# Patient Record
Sex: Male | Born: 1970 | Race: White | Hispanic: No | Marital: Single | Smoking: Current every day smoker
Health system: Southern US, Community
[De-identification: ages and names within clinical notes are randomized; demographics above are authoritative.]

## PROBLEM LIST (undated history)

## (undated) HISTORY — PX: NO PAST SURGERIES: SHX2092

---

## 2005-12-11 ENCOUNTER — Emergency Department (HOSPITAL_COMMUNITY): Admission: EM | Admit: 2005-12-11 | Discharge: 2005-12-11 | Payer: Self-pay | Admitting: Family Medicine

## 2006-03-17 ENCOUNTER — Emergency Department (HOSPITAL_COMMUNITY): Admission: EM | Admit: 2006-03-17 | Discharge: 2006-03-17 | Payer: Self-pay | Admitting: Emergency Medicine

## 2006-12-02 ENCOUNTER — Emergency Department (HOSPITAL_COMMUNITY): Admission: EM | Admit: 2006-12-02 | Discharge: 2006-12-03 | Payer: Self-pay | Admitting: Emergency Medicine

## 2007-09-18 ENCOUNTER — Encounter: Admission: RE | Admit: 2007-09-18 | Discharge: 2007-09-18 | Payer: Self-pay | Admitting: Internal Medicine

## 2012-06-29 ENCOUNTER — Encounter (HOSPITAL_COMMUNITY): Payer: Self-pay | Admitting: Emergency Medicine

## 2012-06-29 ENCOUNTER — Inpatient Hospital Stay (HOSPITAL_COMMUNITY)
Admission: EM | Admit: 2012-06-29 | Discharge: 2012-07-01 | DRG: 294 | Disposition: A | Discharge status: Home / Self Care | Payer: BC Managed Care – HMO | Attending: Internal Medicine | Admitting: Internal Medicine

## 2012-06-29 DIAGNOSIS — R21 Rash and other nonspecific skin eruption: Secondary | ICD-10-CM | POA: Diagnosis present

## 2012-06-29 DIAGNOSIS — Z720 Tobacco use: Secondary | ICD-10-CM | POA: Diagnosis present

## 2012-06-29 DIAGNOSIS — E1310 Other specified diabetes mellitus with ketoacidosis without coma: Principal | ICD-10-CM | POA: Diagnosis present

## 2012-06-29 DIAGNOSIS — Z7289 Other problems related to lifestyle: Secondary | ICD-10-CM

## 2012-06-29 DIAGNOSIS — F17200 Nicotine dependence, unspecified, uncomplicated: Secondary | ICD-10-CM | POA: Diagnosis present

## 2012-06-29 DIAGNOSIS — F1090 Alcohol use, unspecified, uncomplicated: Secondary | ICD-10-CM

## 2012-06-29 DIAGNOSIS — D696 Thrombocytopenia, unspecified: Secondary | ICD-10-CM | POA: Diagnosis present

## 2012-06-29 DIAGNOSIS — E871 Hypo-osmolality and hyponatremia: Secondary | ICD-10-CM | POA: Diagnosis present

## 2012-06-29 DIAGNOSIS — I10 Essential (primary) hypertension: Secondary | ICD-10-CM | POA: Diagnosis present

## 2012-06-29 DIAGNOSIS — E1110 Type 2 diabetes mellitus with ketoacidosis without coma: Secondary | ICD-10-CM | POA: Diagnosis present

## 2012-06-29 DIAGNOSIS — E860 Dehydration: Secondary | ICD-10-CM | POA: Diagnosis present

## 2012-06-29 DIAGNOSIS — Z9119 Patient's noncompliance with other medical treatment and regimen: Secondary | ICD-10-CM

## 2012-06-29 DIAGNOSIS — Z91199 Patient's noncompliance with other medical treatment and regimen due to unspecified reason: Secondary | ICD-10-CM

## 2012-06-29 LAB — GLUCOSE, CAPILLARY
Glucose-Capillary: 384 mg/dL — ABNORMAL HIGH (ref 70–99)
Glucose-Capillary: 250 mg/dL — ABNORMAL HIGH (ref 70–99)

## 2012-06-29 LAB — BLOOD GAS, VENOUS
Acid-base deficit: 5.6 mmol/L — ABNORMAL HIGH (ref 0.0–2.0)
Bicarbonate: 19.2 mEq/L — ABNORMAL LOW (ref 20.0–24.0)
FIO2: 0.21 %
Patient temperature: 98.6
pCO2, Ven: 37.4 mmHg — ABNORMAL LOW (ref 45.0–50.0)

## 2012-06-29 LAB — BASIC METABOLIC PANEL
Chloride: 97 mEq/L (ref 96–112)
GFR calc non Af Amer: >90 mL/min (ref >90)
Sodium: 131 mEq/L — ABNORMAL LOW (ref 135–145)

## 2012-06-29 LAB — CBC
Hemoglobin: 16.3 g/dL (ref 13.0–17.0)
MCH: 31.8 pg (ref 26.0–34.0)
RDW: 13.2 % (ref 11.5–15.5)

## 2012-06-29 LAB — URINALYSIS, ROUTINE W REFLEX MICROSCOPIC
Bilirubin Urine: NEGATIVE
Glucose, UA: >1000 mg/dL — AB
Hgb urine dipstick: NEGATIVE
Urobilinogen, UA: 0.2 mg/dL (ref 0.0–1.0)

## 2012-06-29 MED ORDER — INSULIN REGULAR BOLUS VIA INFUSION
0.0000 [IU] | Freq: Three times a day (TID) | INTRAVENOUS | Status: DC
  Filled 2012-06-29: qty 10

## 2012-06-29 MED ORDER — DEXTROSE 50 % IV SOLN: 25.0000 mL | INTRAVENOUS | Status: DC | PRN

## 2012-06-29 MED ORDER — LORAZEPAM 2 MG/ML IJ SOLN: 1.0000 mg | Freq: Four times a day (QID) | INTRAMUSCULAR | Status: DC | PRN

## 2012-06-29 MED ORDER — SODIUM CHLORIDE 0.9 % IV BOLUS (SEPSIS)
1000.0000 mL | Freq: Once | INTRAVENOUS | Status: AC
  Administered 2012-06-29: 1000 mL via INTRAVENOUS

## 2012-06-29 MED ORDER — DEXTROSE-NACL 5-0.45 % IV SOLN: INTRAVENOUS | Status: DC

## 2012-06-29 MED ORDER — FOLIC ACID 1 MG PO TABS
1.0000 mg | ORAL_TABLET | Freq: Every day | ORAL | Status: DC
  Administered 2012-06-30 – 2012-07-01 (×2): 1 mg via ORAL
  Filled 2012-06-29 (×2): qty 1

## 2012-06-29 MED ORDER — VITAMIN B-1 100 MG PO TABS
100.0000 mg | ORAL_TABLET | Freq: Every day | ORAL | Status: DC
  Administered 2012-06-30 – 2012-07-01 (×2): 100 mg via ORAL
  Filled 2012-06-29 (×2): qty 1

## 2012-06-29 MED ORDER — ADULT MULTIVITAMIN W/MINERALS CH
1.0000 | ORAL_TABLET | Freq: Every day | ORAL | Status: DC
  Administered 2012-06-30 – 2012-07-01 (×2): 1 via ORAL
  Filled 2012-06-29 (×2): qty 1

## 2012-06-29 MED ORDER — SODIUM CHLORIDE 0.9 % IV SOLN
INTRAVENOUS | Status: DC
  Administered 2012-06-29: via INTRAVENOUS

## 2012-06-29 MED ORDER — LORAZEPAM 1 MG PO TABS
1.0000 mg | ORAL_TABLET | Freq: Four times a day (QID) | ORAL | Status: DC | PRN
  Administered 2012-06-30: 1 mg via ORAL
  Filled 2012-06-29: qty 1

## 2012-06-29 MED ORDER — INSULIN REGULAR HUMAN 100 UNIT/ML IJ SOLN
INTRAMUSCULAR | Status: DC
  Administered 2012-06-29: 1.9 [IU]/h via INTRAVENOUS
  Filled 2012-06-29: qty 1

## 2012-06-29 MED ORDER — THIAMINE HCL 100 MG/ML IJ SOLN
100.0000 mg | Freq: Every day | INTRAMUSCULAR | Status: DC
  Filled 2012-06-29 (×2): qty 1

## 2012-06-29 NOTE — ED Provider Notes (Signed)
History     CSN: [id number]  Arrival date & time 06/29/12  2027   First MD Initiated Contact with Patient 06/29/12 2101      Chief Complaint  Patient presents with  . Dizziness    (Consider location/radiation/quality/duration/timing/severity/associated sxs/prior treatment) HPI Comments: Patient with a history of type 1 diabetes presents emergency department after having blood sugars in the 300s.  She was brought via [employer] custody for driving while impaired.  Patient states that he has been noncompliant on his diabetic medication including metformin 500 twice a day x2 months.  He explains that he did not have refills or primary care physician for his prescription.  Patient has been complaining of dizziness and occasional "foggy thinking".  Associated symptoms include polyuria, nocturia, & increase thirst. He denies any abdominal pain, nausea, vomiting, syncope, fever, night sweats, chills, recent illness, chest pain, shortness of breath or muscle aches.    The history is provided by the patient.    Past Medical History  Diagnosis Date  . Diabetes mellitus     History reviewed. No pertinent past surgical history.  Family History  Problem Relation Age of Onset  . Diabetes Other   . Hypertension Other     History  Substance Use Topics  . Smoking status: Current Everyday Smoker  . Smokeless tobacco: Not on file  . Alcohol Use: Yes      Review of Systems  Constitutional: Negative for fever, chills, diaphoresis, activity change and appetite change.  HENT: Negative for congestion and neck pain.   Eyes: Negative for visual disturbance.  Respiratory: Negative for cough and shortness of breath.   Cardiovascular: Negative for chest pain and leg swelling.  Gastrointestinal: Negative for abdominal pain.  Genitourinary: Positive for frequency. Negative for dysuria and urgency.  Musculoskeletal: Negative for myalgias.  Skin: Negative for color change and wound.  Neurological:  Positive for dizziness. Negative for syncope, weakness, light-headedness, numbness and headaches.  Psychiatric/Behavioral: Negative for confusion.  All other systems reviewed and are negative.    Allergies  Review of patient's allergies indicates no known allergies.  Home Medications   Current Outpatient Rx  Name Route Sig Dispense Refill  . IBUPROFEN 200 MG PO TABS Oral Take 400 mg by mouth every 6 (six) hours as needed. Pain      BP 153/134  Pulse 109  Temp 98.7 F (37.1 C) (Oral)  Resp 16  SpO2 94%  Physical Exam  Nursing note and vitals reviewed. Constitutional: He is oriented to person, place, and time. He appears well-developed and well-nourished. No distress.       Patient not in visible distress, fruity breath  HENT:  Head: Normocephalic and atraumatic.  Eyes: Conjunctivae and EOM are normal. Pupils are equal, round, and reactive to light.  Neck: Normal range of motion. Neck supple. Normal carotid pulses, no hepatojugular reflux and no JVD present. Carotid bruit is not present.       No carotid bruits  Cardiovascular:       Tachycardic regular rhythm, no aberrancy on auscultation, intact distal pulses no pitting edema bilaterally.  Pulmonary/Chest: Effort normal and breath sounds normal. No respiratory distress.       Lungs clear to auscultation bilaterally, no acute respiratory distress, accessory muscle use or nasal flaring.  Abdominal: Soft. He exhibits no distension. There is no tenderness.       Nonpulsatile aorta  Musculoskeletal: Normal range of motion. He exhibits no tenderness.  Neurological: He is alert and oriented to person, place,  and time. He displays a negative Romberg sign.       Cranial nerves III through XII intact, normal coordination, negative Romberg, strength 5/5 bilaterally. No ataxia  Skin: Skin is warm and dry. No pallor.       Erythematous molted rash on upper extremities and anterior trunk.   Psychiatric: He has a normal mood and affect.  His behavior is normal.    ED Course  Procedures (including critical care time)  Labs Reviewed  GLUCOSE, CAPILLARY - Abnormal; Notable for the following:    Glucose-Capillary 384 (*)     All other components within normal limits  URINALYSIS, ROUTINE W REFLEX MICROSCOPIC - Abnormal; Notable for the following:    Glucose, UA >1000 (*)     All other components within normal limits  BASIC METABOLIC PANEL - Abnormal; Notable for the following:    Sodium 131 (*)     CO2 17 (*)     Glucose, Bld 406 (*)     All other components within normal limits  CBC - Abnormal; Notable for the following:    Platelets 149 (*)     All other components within normal limits  URINE MICROSCOPIC-ADD ON  BLOOD GAS, VENOUS   No results found.   No diagnosis found.    MDM  DKA  Patient is a type I diabetic presents emergency Department tachycardic withfruity breath complaining of dizziness, polyuria, nocturia, and increase thirst.  Patient has been noncompliant on his metformin for 2 months.  Labs reviewed.  Patient is acidotic with anion gap of 17.  Blood glucose of 400 and CBG pending.  Fluids have been started, pt on glucose stabalizer and hospitalist contacted for admission.  The patient appears reasonably stabilized for admission considering the current resources, flow, and capabilities available in the ED at this time, and I doubt any other [facility] requiring further screening and/or treatment in the ED prior to admission.         Jaci Carrel, [place] 06/29/12 2258

## 2012-06-29 NOTE — ED Notes (Signed)
Pt was checked out by EMS for c/o dizziness and fatigue  Pt states he is diabetic and EMS said sugar was in the 300s so pt brought here to be checked out  Pt is under police custody for driving while impaired  Pt is in handcuffs upon arrival to dept

## 2012-06-29 NOTE — ED Notes (Signed)
Pt reports drinking 1 beer today. Pt states he is a type 1 diabetic. Pt states he did not take his insulin today because he is out of insulin. Pt reports increase urinary frequency, dizziness, and thirst.

## 2012-06-29 NOTE — H&P (Signed)
[facility] History and Physical  Isabel Freese ZOX:[id number] DOB: 1971-05-21 DOA: 06/29/2012  Referring physician: Bethann Berkshire, MD PCP: No primary provider on file.   Chief Complaint: Dizziness.  HPI: Calvin Long is a 41 y.o. male  41 year old male patient with history of type 2 diabetes mellitus diagnosed 5-6 years ago, noncompliant with medications or M.D. followups, was brought to the ED via [employer] custody for DUI with complaints of dizziness. Patient indicates that he only drank 2 beers tonight and when he was pulled over by the police, he had dizziness but denies any headache or passing out or blurred vision. He complains of dry mouth, polyuria, polydipsia and burning of his feet. The symptoms have been ongoing for a couple of months. He does not check his blood sugars. He stopped taking his oral hypoglycemic medications approximately 6 months ago when his insurance had temporarily expired. He has not seen an M.D. for greater than 2 years. He developed a generalized skin rash over the last month which is intermittently pruritic. In the emergency department, evaluation reveals sodium 131, bicarbonate 17, blood sugar 406, platelets 149, venous blood gas pH of 7.331, anion gap of 17. The hospitalist service is requested to admit for further evaluation and management.  Review of Systems: The patient denies anorexia, fever, weight loss,, vision loss, decreased hearing, hoarseness, chest pain, syncope, dyspnea on exertion, peripheral edema, balance deficits, hemoptysis, abdominal pain, melena, hematochezia, severe indigestion/heartburn, hematuria, incontinence, genital sores, muscle weakness, suspicious skin lesions, transient blindness, difficulty walking, depression, unusual weight change, abnormal bleeding, enlarged lymph nodes, angioedema, and breast masses.   All systems reviewed and apart from history of presenting illness is pertinent for burning pain in both feet which he has  had intermittently for the last couple of months which at times makes it difficult for ambulation. He denies any asymmetrical limb weakness or slurred speech or facial asymmetry. Rest of review of system is negative.  Past Medical History  Diagnosis Date  . Diabetes mellitus    Past Surgical History  Procedure Date  . No past surgeries    Social History:  reports that he has been smoking.  He does not have any smokeless tobacco history on file. He reports that he drinks alcohol. He reports that he does not use illicit drugs. Patient is single. He works as a Medical illustrator. He is independent of activities of daily living. He smokes one to one and a half packs of cigarettes per day. He claims that he drinks 2 times per week including a couple of beers. He denies drug abuse.  No Known Allergies  Family History  Problem Relation Age of Onset  . Diabetes Other   . Hypertension Other   . Diabetes Mother    Prior to Admission medications   Medication Sig Start Date End Date Taking? Authorizing Provider  ibuprofen (ADVIL,MOTRIN) 200 MG tablet Take 400 mg by mouth every 6 (six) hours as needed. Pain   Yes Historical Provider, MD   Physical Exam: Filed Vitals:   06/29/12 2117 06/29/12 2118 06/29/12 2304 06/29/12 2324  BP: 163/139 153/134  137/80  Pulse: 111 109  77  Temp:      TempSrc:      Resp:    16  Height:   6\' 1"  (1.854 m)   Weight:   102.059 kg (225 lb)   SpO2:    97%   General exam: Moderately built and nourished male patient who is lying comfortably supine on the gurney and is  in no obvious distress.  Head, eyes and ENT: Nontraumatic and normocephalic. Oral mucosa dry but no other acute findings. Lymphatics: No lymphadenopathy.  Neck: Supple. No JVD or carotid bruits.  Respiratory system: Clear. No increased work of breathing.  Cardiovascular system: First and second heart sounds heard, regular rate and rhythm. No murmurs, gallops or JVD. Gastrointestinal system: Abdomen is  nondistended, nontender, soft and normal bowel sounds heard. No organomegaly or masses appreciated.  Central nervous system: Alert and oriented. No focal neurological deficits.  Extremities: Symmetric 5 x 5 power.  Skin: Diffuse, macular skin rash on trunk and upper extremities with irregular margins, slightly erythematous. Musculoskeletal system: Negative exam.  Psychiatric: Pleasant and cooperative.  Labs on Admission:  Basic Metabolic Panel:  Lab 06/29/12 7829  NA 131*  K 3.9  CL 97  CO2 17*  GLUCOSE 406*  BUN 11  CREATININE 0.70  CALCIUM 8.9  MG --  PHOS --   Liver Function Tests: No results found for this basename: AST:5,ALT:5,ALKPHOS:5,BILITOT:5,PROT:5,ALBUMIN:5 in the last 168 hours No results found for this basename: LIPASE:5,AMYLASE:5 in the last 168 hours No results found for this basename: AMMONIA:5 in the last 168 hours CBC:  Lab 06/29/12 2107  WBC 6.1  NEUTROABS --  HGB 16.3  HCT 46.1  MCV 90.0  PLT 149*   Cardiac Enzymes: No results found for this basename: CKTOTAL:5,CKMB:5,CKMBINDEX:5,TROPONINI:5 in the last 168 hours  BNP (last 3 results) No results found for this basename: PROBNP:3 in the last 8760 hours CBG:  Lab 06/29/12 2326 06/29/12 2035  GLUCAP 250* 384*    Radiological Exams on Admission: No results found.  VBG: PH 7.331, PCO2 37.4, PO2 55.    Assessment/Plan Principal Problem:  *DKA, type 2, not at goal Active Problems:  Dehydration  Hyponatremia  Tobacco abuse  Alcohol use  HTN (hypertension)  Skin rash  Thrombocytopenia   1. Mild DKA in patient with uncontrolled type 2 diabetes mellitus secondary to noncompliance: Admit to telemetry. Aggressive IV fluid hydration and IV insulin. Once anion gap has closed and bicarbonate has normalized, transition to sliding scale insulin +/-Lantus. Check hemoglobin A1c. Counseled patient regarding compliance with diabetes treatment and he verbalized understanding. We'll get diabetes  coordinator to consult. 2. Dehydration: Secondary to problem #1. IV fluids. 3. Hyponatremia: Due to combination of hyperglycemia and dehydration. Management as above and monitor BMPs. 4. Hypertension: Patient denies history of hypertension. Patient had markedly elevated blood pressure on arrival but has spontaneously improved. Monitor closely off medications. 5. Tobacco abuse: Cessation counseled. 6. Alcohol use: Unclear as to amount of usage. Will place on Ativan protocol. 7. Mild thrombocytopenia: Follow CBCs in a.m.  8. Skin rash: Unclear etiology.? Related to diabetes mellitus. Consider dermatology consultation as outpatient.  Code Status: Full  Family Communication: None  Disposition Plan: To be determined.   Time spent: 55 minutes.   [facility] Pager [phone].   If 7PM-7AM, please contact night-coverage [email or web address] Password [facility] 06/29/2012, 11:53 PM

## 2012-06-29 NOTE — ED Provider Notes (Signed)
Medical screening examination/treatment/procedure(s) were performed by non-physician practitioner and as supervising physician I was immediately available for consultation/collaboration.   Kiara Mcdowell L Colman Birdwell, MD 06/29/12 2321 

## 2012-06-30 ENCOUNTER — Encounter (HOSPITAL_COMMUNITY): Payer: Self-pay | Admitting: *Deleted

## 2012-06-30 DIAGNOSIS — Z7289 Other problems related to lifestyle: Secondary | ICD-10-CM

## 2012-06-30 LAB — GLUCOSE, CAPILLARY
Glucose-Capillary: 192 mg/dL — ABNORMAL HIGH (ref 70–99)
Glucose-Capillary: 218 mg/dL — ABNORMAL HIGH (ref 70–99)
Glucose-Capillary: 138 mg/dL — ABNORMAL HIGH (ref 70–99)
Glucose-Capillary: 144 mg/dL — ABNORMAL HIGH (ref 70–99)
Glucose-Capillary: 173 mg/dL — ABNORMAL HIGH (ref 70–99)
Glucose-Capillary: 337 mg/dL — ABNORMAL HIGH (ref 70–99)
Glucose-Capillary: 200 mg/dL — ABNORMAL HIGH (ref 70–99)

## 2012-06-30 LAB — BASIC METABOLIC PANEL
BUN: 8 mg/dL (ref 6–23)
BUN: 7 mg/dL (ref 6–23)
BUN: 8 mg/dL (ref 6–23)
CO2: 19 mEq/L (ref 19–32)
CO2: 18 mEq/L — ABNORMAL LOW (ref 19–32)
CO2: 20 mEq/L (ref 19–32)
Calcium: 7.7 mg/dL — ABNORMAL LOW (ref 8.4–10.5)
Chloride: 105 mEq/L (ref 96–112)
Chloride: 105 mEq/L (ref 96–112)
Chloride: 104 mEq/L (ref 96–112)
Creatinine, Ser: 0.67 mg/dL (ref 0.50–1.35)
Creatinine, Ser: 0.66 mg/dL (ref 0.50–1.35)
Creatinine, Ser: 0.66 mg/dL (ref 0.50–1.35)
GFR calc Af Amer: >90 mL/min (ref >90)
GFR calc non Af Amer: >90 mL/min (ref >90)
Glucose, Bld: 228 mg/dL — ABNORMAL HIGH (ref 70–99)
Glucose, Bld: 171 mg/dL — ABNORMAL HIGH (ref 70–99)
Glucose, Bld: 158 mg/dL — ABNORMAL HIGH (ref 70–99)
Potassium: 3.8 mEq/L (ref 3.5–5.1)
Potassium: 3.7 mEq/L (ref 3.5–5.1)
Potassium: 3.5 mEq/L (ref 3.5–5.1)
Sodium: 134 mEq/L — ABNORMAL LOW (ref 135–145)

## 2012-06-30 LAB — HEMOGLOBIN A1C: Hgb A1c MFr Bld: 11.7 % — ABNORMAL HIGH (ref <5.7)

## 2012-06-30 MED ORDER — DEXTROSE-NACL 5-0.45 % IV SOLN
INTRAVENOUS | Status: DC
  Administered 2012-06-30: 03:00:00 via INTRAVENOUS

## 2012-06-30 MED ORDER — BD GETTING STARTED TAKE HOME KIT: 3/10ML X 30G SYRINGES
1.0000 | Freq: Once | Status: DC
Start: 2012-06-30 — End: 2012-06-30

## 2012-06-30 MED ORDER — INSULIN ASPART 100 UNIT/ML ~~LOC~~ SOLN: 0.0000 [IU] | Freq: Every day | SUBCUTANEOUS | Status: DC

## 2012-06-30 MED ORDER — SODIUM CHLORIDE 0.9 % IV SOLN
INTRAVENOUS | Status: DC
  Administered 2012-06-30: 1.5 [IU]/h via INTRAVENOUS
  Filled 2012-06-30: qty 1

## 2012-06-30 MED ORDER — POTASSIUM CHLORIDE 10 MEQ/100ML IV SOLN
10.0000 meq | INTRAVENOUS | Status: AC
  Administered 2012-06-30 (×2): 10 meq via INTRAVENOUS
  Filled 2012-06-30 (×2): qty 100

## 2012-06-30 MED ORDER — INSULIN ASPART 100 UNIT/ML ~~LOC~~ SOLN
0.0000 [IU] | Freq: Three times a day (TID) | SUBCUTANEOUS | Status: DC
Start: 2012-06-30 — End: 2012-07-01
  Administered 2012-06-30 – 2012-07-01 (×2): 3 [IU] via SUBCUTANEOUS
  Administered 2012-07-01: 5 [IU] via SUBCUTANEOUS

## 2012-06-30 MED ORDER — PNEUMOCOCCAL VAC POLYVALENT 25 MCG/0.5ML IJ INJ
0.5000 mL | INJECTION | INTRAMUSCULAR | Status: AC
  Administered 2012-07-01: 0.5 mL via INTRAMUSCULAR
  Filled 2012-06-30: qty 0.5

## 2012-06-30 MED ORDER — LIVING WELL WITH DIABETES BOOK
Freq: Once | Status: DC
  Filled 2012-06-30: qty 1

## 2012-06-30 MED ORDER — ENOXAPARIN SODIUM 40 MG/0.4ML ~~LOC~~ SOLN
40.0000 mg | SUBCUTANEOUS | Status: DC
  Administered 2012-06-30 – 2012-07-01 (×2): 40 mg via SUBCUTANEOUS
  Filled 2012-06-30 (×2): qty 0.4

## 2012-06-30 MED ORDER — ACETAMINOPHEN 325 MG PO TABS
650.0000 mg | ORAL_TABLET | Freq: Four times a day (QID) | ORAL | Status: DC | PRN
  Administered 2012-06-30: 650 mg via ORAL
  Filled 2012-06-30 (×2): qty 2

## 2012-06-30 MED ORDER — DEXTROSE 50 % IV SOLN: 25.0000 mL | INTRAVENOUS | Status: DC | PRN

## 2012-06-30 MED ORDER — INSULIN ASPART 100 UNIT/ML ~~LOC~~ SOLN
15.0000 [IU] | Freq: Once | SUBCUTANEOUS | Status: AC
  Administered 2012-06-30: 15 [IU] via SUBCUTANEOUS

## 2012-06-30 MED ORDER — BD GETTING STARTED TAKE HOME KIT: 1/2ML X 30G SYRINGES
1.0000 | Freq: Once | Status: DC
  Filled 2012-06-30: qty 1

## 2012-06-30 MED ORDER — INSULIN GLARGINE 100 UNIT/ML ~~LOC~~ SOLN
12.0000 [IU] | Freq: Every day | SUBCUTANEOUS | Status: DC
  Administered 2012-06-30 – 2012-07-01 (×2): 12 [IU] via SUBCUTANEOUS

## 2012-06-30 MED ORDER — SODIUM CHLORIDE 0.9 % IV SOLN
INTRAVENOUS | Status: DC
  Administered 2012-06-30 (×2): via INTRAVENOUS
  Administered 2012-07-01: 125 mL/h via INTRAVENOUS

## 2012-06-30 NOTE — Progress Notes (Signed)
Inpatient Diabetes Program Recommendations  AACE/ADA: New Consensus Statement on Inpatient Glycemic Control (2013)  Target Ranges:  Prepandial:   less than 140 mg/dL      Peak postprandial:   less than 180 mg/dL (1-2 hours)      Critically ill patients:  140 - 180 mg/dL   Reason for Visit: MD Consult  Inpatient Diabetes Program Recommendations Insulin - IV drip/GlucoStabilizer: Glucostabilizer used initially Insulin - Basal: Currently on lantus 14 units daily Correction (SSI): Currently on moderate correction scale Insulin - Meal Coverage: none ordered Oral Agents: none ordered HgbA1C: Hgb A1C is 11.7 Outpatient Referral: Patient states that he is interested in outpatient diabetes education follow-up.  Has never had. Diet: Currently on CHO modified medium  Note: Patient receptive to my visit.  Answered questions, polite, but not very talkative-- did not elaborate.  States that he currently has insurance-- got it about 8 months ago.  Had not taken metformin regularly previously because he had no insurance.  Had meant to see MD-- but put it off and "now I am here".  States that he may want a different MD to follow-up with after discharge.  Will request Case Management consult to investigate PCP options.  Patient states that he will take whatever medication is ordered for diabetes.  If MD thinks he needs insulin, he will take it.  Has no glucose meter.  Needs Rx for meter.  Gave him info regarding [employer] meter from [employer] in case he loses insurance in the future.  He sees no obstacle in taking medication since he has insurance.  MD notes ETOH use unclear.  Patient states he only had one beer and was stopped by the police for DWI.   Cautioned patient that if he is going to drink alcohol, needs to do so in moderation such as 1-2 beers/day.  Patient states he is interested in Outpatient Diabetes Education follow-up.  Would place order for same, but request MD opinion as to whether or not patient  needs ETOH cessation intervention.  If ETOH abuse is an issue, Outpatient Ed would not be appropriate-and needs to be addressed before diabetes can be effectively managed.  Nursing staff to instruct regarding insulin preparation and administration and basic diabetes self-care.  Instructed patient in accessing Patient Education Network.  Ordered diabetes booklet and insulin starter kit.

## 2012-06-30 NOTE — Progress Notes (Signed)
[facility] PROGRESS NOTE  Calvin Long ZOX:[id number] DOB: 08-19-1971 DOA: 06/29/2012 PCP: No primary provider on file.  Assessment/Plan: Principal Problem:  *DKA, type 2, not at goal Active Problems:  Dehydration  Hyponatremia  Tobacco abuse  Alcohol use  HTN (hypertension)  Skin rash  Thrombocytopenia 1.  1. Mild DKA in patient with uncontrolled type 2 diabetes mellitus secondary to noncompliance - continue IV fluids for hydration. anion gap has closed and bicarbonate has normalized, will transition to sliding scale insulin with Lantus.  hemoglobin A1c markedly elevated. outpt dm education 2. Dehydration: Secondary to problem #1. IV fluids. 3. Hyponatremia: improved with Management as above and monitor BMPs. 4. Hypertension: Patient denies history of hypertension. Patient had markedly elevated blood pressure on arrival but has spontaneously improved. Monitor closely off medications - so far BPs remaining well controlled. 5. Tobacco abuse: Cessation counseled. 6. Alcohol use: Unclear as to amount of usage. on Ativan protocol.SW consult for resources to quit 7. Mild thrombocytopenia: Follow recheck CBCs in a.m., likely secondary to alcohol  8. Skin rash: Unclear etiology.? Related to diabetes mellitus. Consider dermatology consultation as outpatient.  Code Status: FULL Family Communication: None available Disposition Plan: home when medically stable   Brief narrative: Pt is a 41yo wm with h/o DM noncompliant with meds and brought to ED by [employer] (secondary to DUI) with c/o dizziness and found to have elevated BG with anion gap acidosis c/w DKA  Consultants:  NONE  Procedures:  NONE  Antibiotics:  none  HPI/Subjective: Denies any new c/o  Objective: Filed Vitals:   06/29/12 2324 06/29/12 2358 06/30/12 0047 06/30/12 0520  BP: 137/80 121/68 117/72 118/65  Pulse: 77 75 79 82  Temp:   98.2 F (36.8 C) 98.5 F (36.9 C)  TempSrc:   Oral Oral  Resp: 16 16 16  20   Height:   6\' 1"  (1.854 m)   Weight:   91.3 kg (201 lb 4.5 oz)   SpO2: 97% 97% 97% 94%    Intake/Output Summary (Last 24 hours) at 06/30/12 0953 Last data filed at 06/30/12 0728  Gross per 24 hour  Intake 904.66 ml  Output      0 ml  Net 904.66 ml   Filed Weights   06/29/12 2304 06/30/12 0047  Weight: 102.059 kg (225 lb) 91.3 kg (201 lb 4.5 oz)    Exam:   General:  A&0x3  Cardiovascular: RRR nl S1S2  Respiratory: clear, no wheezes  Abdomen: soft, +BS NT/ND  Data Reviewed: Basic Metabolic Panel:  Lab 06/30/12 8119 06/30/12 0630 06/30/12 0415 06/30/12 0138 06/29/12 2107  NA 133* 134* 135 136 131*  K 3.5 3.7 3.9 3.8 3.9  CL 104 106 105 105 97  CO2 23 20 18* 19 17*  GLUCOSE 158* 171* 228* 236* 406*  BUN 8 7 8 8 11   CREATININE 0.66 0.66 0.66 0.67 0.70  CALCIUM 7.9* 7.9* 7.7* 7.8* 8.9  MG -- -- -- -- --  PHOS -- -- -- -- --   Liver Function Tests: No results found for this basename: AST:5,ALT:5,ALKPHOS:5,BILITOT:5,PROT:5,ALBUMIN:5 in the last 168 hours No results found for this basename: LIPASE:5,AMYLASE:5 in the last 168 hours No results found for this basename: AMMONIA:5 in the last 168 hours CBC:  Lab 06/29/12 2107  WBC 6.1  NEUTROABS --  HGB 16.3  HCT 46.1  MCV 90.0  PLT 149*   Cardiac Enzymes: No results found for this basename: CKTOTAL:5,CKMB:5,CKMBINDEX:5,TROPONINI:5 in the last 168 hours BNP (last 3 results) No results found  for this basename: PROBNP:3 in the last 8760 hours CBG:  Lab 06/30/12 0605 06/30/12 0448 06/30/12 0350 06/30/12 0242 06/30/12 0117  GLUCAP 159* 218* 192* 206* 229*    No results found for this or any previous visit (from the past 240 hour(s)).   Studies: No results found.  Scheduled Meds:   . enoxaparin  40 mg Subcutaneous Q24H  . folic acid  1 mg Oral Daily  . insulin aspart  0-15 Units Subcutaneous TID WC  . insulin aspart  0-5 Units Subcutaneous QHS  . insulin glargine  12 Units Subcutaneous Daily  .  multivitamin with minerals  1 tablet Oral Daily  . pneumococcal 23 valent vaccine  0.5 mL Intramuscular Tomorrow-1000  . potassium chloride  10 mEq Intravenous Q1H  . sodium chloride  1,000 mL Intravenous Once  . sodium chloride  1,000 mL Intravenous Once  . thiamine  100 mg Oral Daily   Or  . thiamine  100 mg Intravenous Daily  . DISCONTD: insulin regular  0-10 Units Intravenous TID WC   Continuous Infusions:   . sodium chloride Stopped (06/30/12 0232)  . dextrose 5 % and 0.45% NaCl 125 mL/hr at 06/30/12 0233  . insulin (NOVOLIN-R) infusion 0.8 Units/hr (06/30/12 0924)  . DISCONTD: sodium chloride Stopped (06/30/12 0026)  . DISCONTD: dextrose 5 % and 0.45% NaCl    . DISCONTD: insulin (NOVOLIN-R) infusion Stopped (06/30/12 0026)    Principal Problem:  *DKA, type 2, not at goal Active Problems:  Dehydration  Hyponatremia  Tobacco abuse  Alcohol use  HTN (hypertension)  Skin rash  Thrombocytopenia    Time spent:    Calvin Long C  [facility] Pager [phone]. If 8PM-8AM, please contact night-coverage at [email or web address], password [facility] 06/30/2012, 9:53 AM  LOS: 1 day

## 2012-06-30 NOTE — Progress Notes (Signed)
Nutrition Brief Note  Patient identified on the Malnutrition Screening Tool (MST) report for unintentional weight loss, generating a score of 3.   Body mass index is 26.56 kg/(m^2). Pt meets criteria for overweight based on current BMI.   Current diet order is Carb Modified, patient is consuming approximately 100% of meals at this time. Labs and medications reviewed.   Patient unwilling to speak with RD. Patient reported his appetite and intake are fine. He reported he lost a little bit of weight over the past 3 months.   No nutrition interventions warranted at this time. If nutrition issues arise, please consult RD.   Calvin Long [facility] [phone]

## 2012-07-01 DIAGNOSIS — D696 Thrombocytopenia, unspecified: Secondary | ICD-10-CM

## 2012-07-01 LAB — GLUCOSE, CAPILLARY: Glucose-Capillary: 238 mg/dL — ABNORMAL HIGH (ref 70–99)

## 2012-07-01 LAB — BASIC METABOLIC PANEL
BUN: 9 mg/dL (ref 6–23)
CO2: 20 mEq/L (ref 19–32)
Calcium: 8 mg/dL — ABNORMAL LOW (ref 8.4–10.5)
Chloride: 103 mEq/L (ref 96–112)
Creatinine, Ser: 0.63 mg/dL (ref 0.50–1.35)
Glucose, Bld: 210 mg/dL — ABNORMAL HIGH (ref 70–99)

## 2012-07-01 LAB — CBC
HCT: 44.2 % (ref 39.0–52.0)
MCH: 31 pg (ref 26.0–34.0)
MCHC: 34.2 g/dL (ref 30.0–36.0)
MCV: 90.8 fL (ref 78.0–100.0)
Platelets: 124 10*3/uL — ABNORMAL LOW (ref 150–400)
RDW: 13.2 % (ref 11.5–15.5)
WBC: 4.5 10*3/uL (ref 4.0–10.5)

## 2012-07-01 MED ORDER — ADULT MULTIVITAMIN W/MINERALS CH: 1.0000 | ORAL_TABLET | Freq: Every day | ORAL | Status: DC

## 2012-07-01 MED ORDER — INSULIN GLARGINE 100 UNIT/ML ~~LOC~~ SOLN: 15.0000 [IU] | Freq: Every day | SUBCUTANEOUS | Status: DC

## 2012-07-01 MED ORDER — THIAMINE HCL 100 MG PO TABS: 100.0000 mg | ORAL_TABLET | Freq: Every day | ORAL | Status: DC

## 2012-07-01 MED ORDER — GLIMEPIRIDE 1 MG PO TABS: 1.0000 mg | ORAL_TABLET | Freq: Every day | ORAL | Status: DC

## 2012-07-01 MED ORDER — FOLIC ACID 1 MG PO TABS: 1.0000 mg | ORAL_TABLET | Freq: Every day | ORAL | Status: DC

## 2012-07-01 NOTE — Care Management Note (Signed)
    Page 1 of 1   07/01/2012     2:29:50 PM   CARE MANAGEMENT NOTE 07/01/2012  Patient:  Calvin Long, Calvin Long   Account Number:  [id number]  Date Initiated:  07/01/2012  Documentation initiated by:  Lanier Clam  Subjective/Objective Assessment:   ADMITTED W/DKA.     Action/Plan:   FROM HOME.HAS GLUCOMETER.HAS BCBS INSURANCE.   Anticipated DC Date:  07/01/2012   Anticipated DC Plan:  HOME/SELF CARE  In-house referral  Clinical Social Worker      DC Planning Services  CM consult      Choice offered to / List presented to:             Status of service:  Completed, signed off Medicare Important Message given?   (If response is "NO", the following Medicare IM given date fields will be blank) Date Medicare IM given:   Date Additional Medicare IM given:    Discharge Disposition:  HOME/SELF CARE  Per UR Regulation:  Reviewed for med. necessity/level of care/duration of stay  If discussed at Long Length of Stay Meetings, dates discussed:    Comments:  07/01/12 Yalena Colon RN,BSN NCM [phone] MADE COPY OF INSURANCE CARD,& TUBED TO ADMITTING.PROVIDED PATIENT W/PCP LISTING,ALONG WITH OTHER COMMUNITY RESOURCES.AFTER 2 ATTEMPTS TO ENCOURAGE CHOOSING PCP WHILE IN HOSPITAL,PATIENT PREFERS TO GET PCP ON OWN,[place] IN [place].MD UPDATED.

## 2012-07-01 NOTE — Discharge Summary (Signed)
Physician Discharge Summary  Calvin Long AVW:[id number] DOB: 1971-04-22 DOA: 06/29/2012  PCP: No primary provider on file.  Admit date: 06/29/2012 Discharge date: 07/01/2012  Recommendations for Outpatient Follow-up:  1. PCP in one to 2 weeks-take log of blood sugars on follow up  Discharge Diagnoses:  Principal Problem:  *DKA, type 2, not at goal Active Problems:  Dehydration  Hyponatremia  Tobacco abuse  Alcohol use  HTN (hypertension)  Skin rash  Thrombocytopenia Code Status: FULL   Discharge Condition: Improved/stable  Diet recommendation: Modified carbohydrates  Filed Weights   06/29/12 2304 06/30/12 0047  Weight: 102.059 kg (225 lb) 91.3 kg (201 lb 4.5 oz)    History of present illness:  Pt is a 41yo wm with h/o DM noncompliant with meds and brought to ED by [employer] (secondary to DUI) with c/o dizziness and found to have elevated BG with anion gap acidosis c/w DKA. He was admitted for further evaluation and management.   Hospital Course by problem list:  1. Mild DKA in patient with uncontrolled type 2 diabetes mellitus secondary to noncompliance -upon admission the patient was started on aggressive hydration with IV fluids and IV insulin via glucose stabilizer.his blood glucose control improved and anion gap has closed and bicarbonate has normalized.he was then transitioned to to sliding scale insulin with Lantus. Has been started on low-dose Amaryl daily, along with Lantus 15 units at each bedtime. He was educated on the importance of compliance with his medications. hemoglobin A1c markedly elevated 11.7. he was set up for outpatient diabetes education 2. Dehydration: Secondary to problem #1. IV fluids. 3. Hyponatremia: improved with Management as above and monitor BMPs. 4. Hypertension: Patient denies history of hypertension. Patient had markedly elevated blood pressure on arrival but has spontaneously improved. His blood pressures remained controlled off medications in  the hospital, his to followup with his PCP for continued monitoring of his blood pressures and further management as appropriate. 5. Tobacco abuse: Cessation counseled. 6. Alcohol use: Unclear as to amount of usage. Was on Ativan protocol with no signs of which all in the hospital.patient was counseled to quit alcohol, SW consulted for resources to quit while in the hospital. 7. Mild thrombocytopenia: Follow recheck CBCs in a.m., likely secondary to alcohol  8. Skin rash: Unclear etiology.? Related to diabetes mellitus. Patient to follow up outpatient with  dermatology    Procedures:  None  Consultations:  None  Discharge Exam: Filed Vitals:   07/01/12 1335  BP: 147/83  Pulse: 78  Temp: 98.9 F (37.2 C)  Resp: 16   Filed Vitals:   06/30/12 2104 07/01/12 0500 07/01/12 1000 07/01/12 1335  BP: 114/71 130/80 135/79 147/83  Pulse: 68 71 80 78  Temp: 98.3 F (36.8 C) 97.9 F (36.6 C) 97.7 F (36.5 C) 98.9 F (37.2 C)  TempSrc: Oral Oral Oral Oral  Resp: 18 18 16 16   Height:      Weight:      SpO2: 96% 97% 95% 97%    Exam:  General: A&0x3  Cardiovascular: RRR nl S1S2  Respiratory: clear, no wheezes  Abdomen: soft, +BS NT/ND     Discharge Instructions  Discharge Orders    Future Orders Please Complete By Expires   Diet Carb Modified      Increase activity slowly        Medication List  As of 07/01/2012  2:38 PM   TAKE these medications         folic acid 1 MG tablet  Commonly known as: FOLVITE   Take 1 tablet (1 mg total) by mouth daily.      glimepiride 1 MG tablet   Commonly known as: AMARYL   Take 1 tablet (1 mg total) by mouth daily before breakfast.      ibuprofen 200 MG tablet   Commonly known as: ADVIL,MOTRIN   Take 400 mg by mouth every 6 (six) hours as needed. Pain      insulin glargine 100 UNIT/ML injection   Commonly known as: LANTUS   Inject 15 Units into the skin each bedtime.      multivitamin with minerals Tabs   Take 1 tablet by  mouth daily.      thiamine 100 MG tablet   Take 1 tablet (100 mg total) by mouth daily.              The results of significant diagnostics from this hospitalization (including imaging, microbiology, ancillary and laboratory) are listed below for reference.    Significant Diagnostic Studies: No results found.  Microbiology: No results found for this or any previous visit (from the past 240 hour(s)).   Labs: Basic Metabolic Panel:  Lab 07/01/12 1610 06/30/12 0815 06/30/12 0630 06/30/12 0415 06/30/12 0138  NA 135 133* 134* 135 136  K 3.8 3.5 3.7 3.9 3.8  CL 103 104 106 105 105  CO2 20 23 20  18* 19  GLUCOSE 210* 158* 171* 228* 236*  BUN 9 8 7 8 8   CREATININE 0.63 0.66 0.66 0.66 0.67  CALCIUM 8.0* 7.9* 7.9* 7.7* 7.8*  MG -- -- -- -- --  PHOS -- -- -- -- --   Liver Function Tests: No results found for this basename: AST:5,ALT:5,ALKPHOS:5,BILITOT:5,PROT:5,ALBUMIN:5 in the last 168 hours No results found for this basename: LIPASE:5,AMYLASE:5 in the last 168 hours No results found for this basename: AMMONIA:5 in the last 168 hours CBC:  Lab 07/01/12 0420 06/29/12 2107  WBC 4.5 6.1  NEUTROABS -- --  HGB 15.1 16.3  HCT 44.2 46.1  MCV 90.8 90.0  PLT 124* 149*   Cardiac Enzymes: No results found for this basename: CKTOTAL:5,CKMB:5,CKMBINDEX:5,TROPONINI:5 in the last 168 hours BNP: BNP (last 3 results) No results found for this basename: PROBNP:3 in the last 8760 hours CBG:  Lab 07/01/12 1150 07/01/12 0738 07/01/12 0012 06/30/12 2024 06/30/12 1627  GLUCAP 215* 189* 238* 200* 337*    Time coordinating discharge: >3minutes  Signed:  Charmion Hapke C  [facility] 07/01/2012, 2:38 PM

## 2012-07-01 NOTE — Progress Notes (Signed)
Instructed patient on insulin administration, patient able to return demonstration drawing up and injecting insulin. States will get new CBG meter at discharge, states has checked his glucose prior and anticipates no issues.

## 2012-07-15 ENCOUNTER — Encounter: Payer: Self-pay | Admitting: Family Medicine

## 2012-07-15 ENCOUNTER — Ambulatory Visit (INDEPENDENT_AMBULATORY_CARE_PROVIDER_SITE_OTHER): Payer: BC Managed Care – HMO | Admitting: Family Medicine

## 2012-07-15 VITALS — BP 130/81 | HR 67 | Temp 98.1°F | Ht 72.5 in | Wt 195.0 lb

## 2012-07-15 DIAGNOSIS — I10 Essential (primary) hypertension: Secondary | ICD-10-CM

## 2012-07-15 DIAGNOSIS — E119 Type 2 diabetes mellitus without complications: Secondary | ICD-10-CM | POA: Insufficient documentation

## 2012-07-15 MED ORDER — GLUCOSE BLOOD VI STRP: ORAL_STRIP | Status: DC

## 2012-07-15 MED ORDER — INSULIN DETEMIR 100 UNIT/ML ~~LOC~~ SOLN: 15.0000 [IU] | Freq: Every day | SUBCUTANEOUS | Status: DC

## 2012-07-15 MED ORDER — METFORMIN HCL 1000 MG PO TABS: ORAL_TABLET | ORAL | Status: DC
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
## 2012-07-15 NOTE — Patient Instructions (Addendum)
Goal fasting (first thing in the morning) blood sugar: 90-120.  This value is typically influenced by your daily Levemir regimen.  Goal post-prandial (2 hours after a meal) blood sugar: less than 180.  Individuals with Type 2 Diabetes benefit from annual eye exams to help minimize the chances of gradual vision loss, let me know if you need help finding a local eye doctor.    Return in one week to discuss blood sugar results.  Levemir 15 Units every evening, check blood sugar every morning first thing before breakfast. Keep blood sugar log.

## 2012-07-23 ENCOUNTER — Ambulatory Visit (INDEPENDENT_AMBULATORY_CARE_PROVIDER_SITE_OTHER): Payer: PRIVATE HEALTH INSURANCE | Admitting: Family Medicine

## 2012-07-23 ENCOUNTER — Encounter: Payer: Self-pay | Admitting: Family Medicine

## 2012-07-23 VITALS — BP 118/80 | HR 72 | Wt 198.0 lb

## 2012-07-23 DIAGNOSIS — E860 Dehydration: Secondary | ICD-10-CM

## 2012-07-23 DIAGNOSIS — I10 Essential (primary) hypertension: Secondary | ICD-10-CM

## 2012-07-23 NOTE — Progress Notes (Signed)
CC: Calvin Long is a 41 y.o. male is here for Diabetes   Subjective: HPI:  2 diabetes: Last visit we started Levemir 15 units every evening as well as metformin 1 g twice a day. He continues on Amaryl. He brings in fasting blood sugars ranging from 200-250 which is incredible improvement from his A1c of 11 he was in the hospital. He denies hyperglycemic episodes but feels more sluggish in the morning, described as having difficulty getting out of bed due to feeling tired. His polyuria, polyphasia, polydipsia is much better but still somewhat present. She has some tingling in his feet but denies any burning, motor sensory disturbances, chest pain, shortness of breath, irregular heartbeat, foot lesions, nor poorly healing wounds. Tries to watch what he eats. He tells me that 5 years ago he weighed almost 300 pounds. He's not been to an eye doctor in over year  Essential hypertension: He has not been on any antihypertensives since being outside the hospital. He's not taken his blood pressure outside of our office. No formal exercise program at this time    Review Of Systems Outlined In HPI  Past Medical History  Diagnosis Date  . Diabetes mellitus      Family History  Problem Relation Age of Onset  . Diabetes Other   . Hypertension Other   . Diabetes Mother      History  Substance Use Topics  . Smoking status: Current Every Day Smoker -- 1.0 packs/day  . Smokeless tobacco: Not on file  . Alcohol Use: Yes     Objective: Filed Vitals:   07/23/12 0857  BP: 118/80  Pulse: 72    General: Alert and Oriented, No Acute Distress HEENT: Pupils equal, round, reactive to light. Conjunctivae clear.  External ears unremarkable, canals clear with intact TMs with appropriate landmarks. Neck supple  Lungs: Clear to auscultation bilaterally, no wheezing/ronchi/rales.  Comfortable work of breathing. Good air movement. Cardiac: Regular rate and rhythm. Normal S1/S2.  No murmurs, rubs, nor  gallops.   Extremities: No peripheral edema. Mental Status: No depression, anxiety, nor agitation.  Assessment & Plan: Calvin Long was seen today for diabetes.  Diagnoses and associated orders for this visit:  Dehydration  Htn (hypertension)    for his hypertension I do not believe he needs any hypertensives at this time. We discussed low-salt diet and regular exercise. First type 2 diabetes will continue on his oral medications will be increasing Levemir 20 units every night he understands it is not getting fasting sugars that were below 120 o'clock 20 units every night. He'll return in 3 weeks for fasting lipids as well as a urine microalbumin and creatinine ratio. Congratulated him on his already successful lowering of his blood sugar. He has already had his flu shot this season, I've asked him to schedule an appointment with his eye doctor for diabetic eye exam. Return in about 3 weeks (around 08/13/2012).

## 2012-07-23 NOTE — Patient Instructions (Signed)
Levemir Regimen: Increase to 20 Units every night, if your blood sugars are not at least less than 120 after one week, increase to 25 Units every night.  Keep taking all your pills.

## 2012-08-13 ENCOUNTER — Ambulatory Visit: Payer: PRIVATE HEALTH INSURANCE | Admitting: Family Medicine

## 2012-09-03 ENCOUNTER — Ambulatory Visit (INDEPENDENT_AMBULATORY_CARE_PROVIDER_SITE_OTHER): Payer: BC Managed Care – HMO | Admitting: Sports Medicine

## 2012-09-03 ENCOUNTER — Encounter: Payer: Self-pay | Admitting: Sports Medicine

## 2012-09-03 VITALS — BP 134/78 | HR 90 | Temp 98.2°F | Resp 14 | Wt 202.0 lb

## 2012-09-03 DIAGNOSIS — L03011 Cellulitis of right finger: Secondary | ICD-10-CM | POA: Insufficient documentation

## 2012-09-03 DIAGNOSIS — L03019 Cellulitis of unspecified finger: Secondary | ICD-10-CM

## 2012-09-03 MED ORDER — HYDROCODONE-ACETAMINOPHEN 5-325 MG PO TABS: 1.0000 | ORAL_TABLET | Freq: Four times a day (QID) | ORAL | Status: DC | PRN

## 2012-09-03 MED ORDER — DOXYCYCLINE HYCLATE 100 MG PO TABS: 100.0000 mg | ORAL_TABLET | Freq: Two times a day (BID) | ORAL | Status: AC

## 2012-09-03 NOTE — Assessment & Plan Note (Addendum)
Incision and drainage as above. Doxycycline for 7 days. Culture. Return to clinic as needed for this.

## 2012-09-03 NOTE — Patient Instructions (Addendum)
Paronychia  Paronychia is an inflammatory reaction involving the folds of the skin surrounding the fingernail. This is commonly caused by an infection in the skin around a nail. The most common cause of paronychia is frequent wetting of the hands (as seen with bartenders, food servers, nurses or others who wet their hands). This makes the skin around the fingernail susceptible to infection by bacteria (germs) or fungus. Other predisposing factors are:   Aggressive manicuring.   Nail biting.   Thumb sucking.  The most common cause is a staphylococcal (a type of germ) infection, or a fungal (Candida) infection. When caused by a germ, it usually comes on suddenly with redness, swelling, pus and is often painful. It may get under the nail and form an abscess (collection of pus), or form an abscess around the nail. If the nail itself is infected with a fungus, the treatment is usually prolonged and may require oral medicine for up to one year. Your caregiver will determine the length of time treatment is required. The paronychia caused by bacteria (germs) may largely be avoided by not pulling on hangnails or picking at cuticles. When the infection occurs at the tips of the finger it is called felon. When the cause of paronychia is from the herpes simplex virus (HSV) it is called herpetic whitlow.  TREATMENT   When an abscess is present treatment is often incision and drainage. This means that the abscess must be cut open so the pus can get out. When this is done, the following home care instructions should be followed.  HOME CARE INSTRUCTIONS    It is important to keep the affected fingers very dry. Rubber or plastic gloves over cotton gloves should be used whenever the hand must be placed in water.   Keep wound clean, dry and dressed as suggested by your caregiver between warm soaks or warm compresses.   Soak in warm water for fifteen to twenty minutes three to four times per day for bacterial infections. Fungal  infections are very difficult to treat, so often require treatment for long periods of time.   For bacterial (germ) infections take antibiotics (medicine which kill germs) as directed and finish the prescription, even if the problem appears to be solved before the medicine is gone.   Only take over-the-counter or prescription medicines for pain, discomfort, or fever as directed by your caregiver.  SEEK IMMEDIATE MEDICAL CARE IF:   You have redness, swelling, or increasing pain in the wound.   You notice pus coming from the wound.   You have a fever.   You notice a bad smell coming from the wound or dressing.  Document Released: 04/09/2001 Document Revised: 01/06/2012 Document Reviewed: 12/09/2008  [employer] Patient Information 2013 [employer].

## 2012-09-03 NOTE — Progress Notes (Signed)
SPORTS MEDICINE CONSULTATION REPORT  Subjective:    CC: Finger infection   HPI: Calvin Long comes to see me with a several day history of pain that he localizes along the ulnar aspect of his right thumb. He has swelling, purulence, redness, and warmth. It is exquisitely tender.  He has had these before and other fingers.  Past medical history, Surgical history, Family history, Social history, Allergies, and medications have been entered into the medical record, reviewed, and no changes needed.   Review of Systems: No headache, visual changes, nausea, vomiting, diarrhea, constipation, dizziness, abdominal pain, skin rash, fevers, chills, night sweats, weight loss, swollen lymph nodes, body aches, joint swelling, muscle aches, chest pain, or shortness of breath.   Objective:   Vitals:  Afebrile, vital signs stable. General: Well Developed, well nourished, and in no acute distress.  Neuro/Psych: Alert and oriented x3, extra-ocular muscles intact, able to move all 4 extremities.  Skin: Warm and dry, no rashes noted.  Respiratory: Not using accessory muscles, speaking in full sentences, trachea midline.  Cardiovascular: Pulses palpable, no extremity edema. Abdomen: Does not appear distended. Left hand: There is paronychia present on the ulnar aspect of the thumb. It is erythematous with visible purulence beneath the nail fold. It is also significantly swollen, and exquisitely tender to palpation. There is no pain present in the palm.   Procedure:  Incision and drainage of paronychia of left thumb.  Risks, benefits, and alternatives explained and consent obtained. Time out conducted. Surface cleaned with alcohol. 5 cc of lidocaine 2% without epinephrine infiltrated in the thumb in a digital block. Adequate anesthesia ensured. Area prepped and draped in a sterile fashion. 18-gauge needle slid under nail fold, and pressure applied to express purulence. A linear incision was then made proximally  from the nail fold. Further purulence was expressed. Hemostasis achieved. Pt stable. Aftercare and follow-up advised.  Impression and Recommendations:   This case required medical decision making of moderate complexity.

## 2012-09-06 LAB — WOUND CULTURE: Gram Stain: NONE SEEN

## 2012-12-04 ENCOUNTER — Encounter: Payer: Self-pay | Admitting: Family Medicine

## 2012-12-04 ENCOUNTER — Ambulatory Visit (INDEPENDENT_AMBULATORY_CARE_PROVIDER_SITE_OTHER): Payer: BC Managed Care – HMO | Admitting: Family Medicine

## 2012-12-04 VITALS — BP 130/87 | HR 92 | Temp 98.1°F | Wt 206.0 lb

## 2012-12-04 DIAGNOSIS — J029 Acute pharyngitis, unspecified: Secondary | ICD-10-CM

## 2012-12-04 DIAGNOSIS — J020 Streptococcal pharyngitis: Secondary | ICD-10-CM

## 2012-12-04 DIAGNOSIS — R110 Nausea: Secondary | ICD-10-CM

## 2012-12-04 MED ORDER — LIDOCAINE VISCOUS 2 % MT SOLN: OROMUCOSAL | Status: DC

## 2012-12-04 MED ORDER — PROMETHAZINE HCL 25 MG PO TABS: 25.0000 mg | ORAL_TABLET | Freq: Four times a day (QID) | ORAL | Status: DC | PRN

## 2012-12-04 MED ORDER — PENICILLIN V POTASSIUM 500 MG PO TABS: ORAL_TABLET | ORAL | Status: DC

## 2012-12-04 MED ORDER — ONDANSETRON HCL 4 MG PO TABS: 4.0000 mg | ORAL_TABLET | Freq: Three times a day (TID) | ORAL | Status: AC | PRN

## 2012-12-04 NOTE — Progress Notes (Signed)
CC: Calvin Long is a 42 y.o. male is here for Sore Throat   Subjective: HPI:   Patient complains of 2 days of sore throat. Symptoms came on abruptly and worsening on a daily basis. Pain is described as knifelike in the back of his throat.. Worse with swallowing No pain with movement of the neck, nothing else makes better or worse. Described as moderate in severity. Associated with subjective fevers and sweats. Has had nausea for the majority of the morning. Mild frontal headache. No vomiting nor abdominal pain nor rashes. Mild to moderate fatigue.  Denies left upper quadrant pain, reddening of the eyes, nasal discharge, cough, shortness of breath, chest pain, confusion, nor GI disturbance    Review Of Systems Outlined In HPI  Past Medical History  Diagnosis Date  . Diabetes mellitus      Family History  Problem Relation Age of Onset  . Diabetes Other   . Hypertension Other   . Diabetes Mother      History  Substance Use Topics  . Smoking status: Current Every Day Smoker -- 1.0 packs/day  . Smokeless tobacco: Not on file  . Alcohol Use: Yes     Objective: Filed Vitals:   12/04/12 1134  BP: 130/87  Pulse: 92  Temp: 98.1 F (36.7 C)    General: Alert and Oriented, No Acute Distress HEENT: Pupils equal, round, reactive to light. Conjunctivae clear.  Moist mucous membranes, pharynx with moderate erythema, midline uvula, white patches on and behind the right tonsil. Neck supple without palpable lymphadenopathy nor abnormal masses. Lungs: Clear to auscultation bilaterally, no wheezing/ronchi/rales.  Comfortable work of breathing. Good air movement. Cardiac: Regular rate and rhythm. Normal S1/S2.  No murmurs, rubs, nor gallops.   Abdomen:  soft and nontender  Extremities: No peripheral edema.  Strong peripheral pulses.  Mental Status: No depression, anxiety, nor agitation. Skin: Warm and dry.  Assessment & Plan: Calvin Long was seen today for sore  throat.  Diagnoses and associated orders for this visit:  Pharyngitis - POCT rapid strep A  Strep pharyngitis - penicillin v potassium (VEETID) 500 MG tablet; One by mouth every 12 hours for ten days, take 1 hour before or 2 hours after meals. - lidocaine (XYLOCAINE) 2 % solution; Gargle two tablespoons every 6 hours for throat pain only as needed.  Nausea - ondansetron (ZOFRAN) 4 MG tablet; Take 1 tablet (4 mg total) by mouth every 8 (eight) hours as needed for nausea. - promethazine (PHENERGAN) 25 MG tablet; Take 1 tablet (25 mg total) by mouth every 6 (six) hours as needed for nausea.    Clinically suspicious for strep pharyngitis, start penicillin, low suspicion for mono however discussed stopping penicillin if rash should occur over the weekend. Given Zofran dose in clinic and if this improves nausea still written prescription however if no improvement he'll fill promethazine written prescription but instructed not to fill both.  Return in about 2 weeks (around 12/18/2012).

## 2012-12-09 ENCOUNTER — Ambulatory Visit (INDEPENDENT_AMBULATORY_CARE_PROVIDER_SITE_OTHER): Payer: BC Managed Care – HMO | Admitting: Family Medicine

## 2012-12-09 VITALS — BP 139/97 | HR 107 | Temp 97.5°F | Wt 207.0 lb

## 2012-12-09 DIAGNOSIS — A499 Bacterial infection, unspecified: Secondary | ICD-10-CM

## 2012-12-09 DIAGNOSIS — B9689 Other specified bacterial agents as the cause of diseases classified elsewhere: Secondary | ICD-10-CM

## 2012-12-09 DIAGNOSIS — J329 Chronic sinusitis, unspecified: Secondary | ICD-10-CM

## 2012-12-09 DIAGNOSIS — E119 Type 2 diabetes mellitus without complications: Secondary | ICD-10-CM

## 2012-12-09 DIAGNOSIS — B360 Pityriasis versicolor: Secondary | ICD-10-CM

## 2012-12-09 MED ORDER — INSULIN DETEMIR 100 UNIT/ML ~~LOC~~ SOLN: 50.0000 [IU] | Freq: Every day | SUBCUTANEOUS | Status: DC

## 2012-12-09 MED ORDER — AMOXICILLIN-POT CLAVULANATE 500-125 MG PO TABS: ORAL_TABLET | ORAL | Status: AC

## 2012-12-09 MED ORDER — KETOCONAZOLE 2 % EX SHAM: MEDICATED_SHAMPOO | CUTANEOUS | Status: DC

## 2012-12-09 NOTE — Patient Instructions (Addendum)
Tinea Versicolor  Tinea versicolor is a common yeast infection of the skin. This condition becomes known when the yeast on our skin starts to overgrow (yeast is a normal inhabitant on our skin). This condition is noticed as white or light brown patches on brown skin, and is more evident in the summer on tanned skin. These areas are slightly scaly if scratched. The light patches from the yeast become evident when the yeast creates "holes in your suntan". This is most often noticed in the summer. The patches are usually located on the chest, back, pubis, neck and body folds. However, it may occur on any area of body. Mild itching and inflammation (redness or soreness) may be present.  DIAGNOSIS   The diagnosisof this is made clinically (by looking). Cultures from samples are usually not needed. Examination under the microscope may help. However, yeast is normally found on skin. The diagnosis still remains clinical. Examination under Wood's Ultraviolet Light can determine the extent of the infection.  TREATMENT   This common infection is usually only of cosmetic (only a concern to your appearance). It is easily treated with dandruff shampoo used during showers or bathing. Vigorous scrubbing will eliminate the yeast over several days time. The light areas in your skin may remain for weeks or months after the infection is cured unless your skin is exposed to sunlight. The lighter or darker spots caused by the fungus that remain after complete treatment are not a sign of treatment failure; it will take a long time to resolve. Your caregiver may recommend a number of commercial preparations or medication by mouth if home care is not working. Recurrence is common and preventative medication may be necessary.  This skin condition is not highly contagious. Special care is not needed to protect close friends and family members. Normal hygiene is usually enough. Follow up is required only if you develop complications (such as a  secondary infection from scratching), if recommended by your caregiver, or if no relief is obtained from the preparations used.  Document Released: 10/11/2000 Document Revised: 01/06/2012 Document Reviewed: 11/23/2008  [employer] Patient Information 2013 [employer].

## 2012-12-09 NOTE — Progress Notes (Signed)
CC: Calvin Long is a 42 y.o. male is here for Sore Throat   Subjective: HPI:   followup strep pharyngitis: Patient started penicillin late night on the eighth of this month. He decided to double up on the dosage and also to get every 6 hours. He ran out of his medication yesterday and continues to have a mild sore throat. He's also noticed that since taking an he's had a moderate sensation of pressure underneath both eyes that is worse first thing in the morning and with lying down. He does state that his sore throat is significantly improved but still bothersome. It is worse when lying down after long periods of time. It does not hurt to move the neck. He also has some pressure in his right ear without loss of hearing nor tinnitus. He denies respiratory complaints such as coughing or shortness of breath. He denies fevers or chills. His nausea is now gone. He denies left upper quadrant plain. He denies fatigue. He denies constipation, diarrhea, abdominal pain, myalgias.  Type 2 diabetes: He mentions that he is titrated his Levemir up to 40 units every evening. Fasting blood sugars are currently at 180. He denies polyphagia polydipsia nor polyuria. Denies poorly healing wounds nor motor or sensory disturbances. Denies vision loss  Points out a rash that's been on his left upper extremity for months. He is confident that he did not start or get worse after starting penicillin. It is slightly itchy. It is worse when exposed to sun or when his body temperature rises. It has been spreading to his chest, arm pits, and groin. He notes its worst in sweaty areas. No interventions as of yet.    Review Of Systems Outlined In HPI  Past Medical History  Diagnosis Date  . Diabetes mellitus      Family History  Problem Relation Age of Onset  . Diabetes Other   . Hypertension Other   . Diabetes Mother      History  Substance Use Topics  . Smoking status: Current Every Day Smoker -- 1.00  packs/day  . Smokeless tobacco: Not on file  . Alcohol Use: Yes     Objective: Filed Vitals:   12/09/12 1133  BP: 139/97  Pulse: 107  Temp: 97.5 F (36.4 C)    General: Alert and Oriented, No Acute Distress HEENT: Pupils equal, round, reactive to light. Conjunctivae clear.  External ears unremarkable, canals clear with intact TMs with appropriate landmarks.  Middle ear appears open without effusion. Pink inferior turbinates.  Moist mucous membranes, pharynx without inflammation nor lesions.  Neck supple without palpable lymphadenopathy nor abnormal masses. Maxillary sinus bilaterally tender to percussion Lungs: Clear to auscultation bilaterally, no wheezing/ronchi/rales.  Comfortable work of breathing. Good air movement. Cardiac: Regular rate and rhythm. Normal S1/S2.  No murmurs, rubs, nor gallops.   Abdomen: No left upper quadrant pain, abdomen is soft Extremities: No peripheral edema.  Strong peripheral pulses.  Mental Status: No depression, anxiety, nor agitation. Skin: Warm and dry. There is a erythematous and splotchy rash encompassing his left arm, right proximal arm, just mostly under the breasts, without abnormalities on the back  Assessment & Plan: Calvin Long was seen today for sore throat.  Diagnoses and associated orders for this visit:  Bacterial sinusitis - amoxicillin-clavulanate (AUGMENTIN) 500-125 MG per tablet; Take one by mouth every 8 hours for ten total days.  Tinea versicolor - ketoconazole (NIZORAL) 2 % shampoo; Apply daily to affected areas daily for three days.  Leave on  skin for 5 minutes then remove.  Repeat weekly if needed.  Type 2 diabetes mellitus - insulin detemir (LEVEMIR FLEXPEN) 100 UNIT/ML injection; Inject 50 Units into the skin at bedtime.    Bacterial sinusitis: I believe strep pharyngitis is now gone however given incorrect penicillin regimen I like to continue coverage of strep with Augmentin which should help sinusitis as well. Tinea  versicolor: Discussed my suspicion that his rash is due to a kidney infection, trial of ketoconazole topical therapy via shampoo/body wash Take 2 diabetes: We went over a regimen where he would titrate his Levemir2-3 units every 2-3 days with a goal fasting blood sugar of 120 or less.   Return in about 4 weeks (around 01/06/2013).

## 2012-12-18 ENCOUNTER — Ambulatory Visit: Payer: BC Managed Care – HMO | Admitting: Family Medicine

## 2012-12-18 DIAGNOSIS — Z0289 Encounter for other administrative examinations: Secondary | ICD-10-CM

## 2013-12-24 ENCOUNTER — Ambulatory Visit (INDEPENDENT_AMBULATORY_CARE_PROVIDER_SITE_OTHER): Payer: BC Managed Care – HMO | Admitting: Family Medicine

## 2013-12-24 ENCOUNTER — Encounter: Payer: Self-pay | Admitting: Family Medicine

## 2013-12-24 VITALS — BP 136/78 | HR 88 | Wt 213.0 lb

## 2013-12-24 DIAGNOSIS — E119 Type 2 diabetes mellitus without complications: Secondary | ICD-10-CM

## 2013-12-24 LAB — POCT GLYCOSYLATED HEMOGLOBIN (HGB A1C): Hemoglobin A1C: 10.5

## 2013-12-24 MED ORDER — INSULIN DETEMIR 100 UNIT/ML ~~LOC~~ SOLN: 70.0000 [IU] | Freq: Every day | SUBCUTANEOUS | Status: DC

## 2013-12-24 MED ORDER — INSULIN DETEMIR 100 UNIT/ML ~~LOC~~ SOLN: 50.0000 [IU] | Freq: Every day | SUBCUTANEOUS | Status: DC

## 2013-12-24 MED ORDER — GLUCOSE BLOOD VI STRP: ORAL_STRIP | Status: AC

## 2013-12-24 MED ORDER — METFORMIN HCL 500 MG PO TABS: 1000.0000 mg | ORAL_TABLET | Freq: Two times a day (BID) | ORAL | Status: DC

## 2013-12-24 NOTE — Patient Instructions (Signed)
   Self Titration of Long Acting Insulin (Lantus or Levemir)  The following strategy can be used to optimize your blood sugar control and requires only once a day blood sugar testing.  Assuming that you are taking your long acting insulin every evening, begin by recording your fasting blood sugar every morning along with the number of units of long acting insulin you used the night before.  If you notice that on two consecutive days your fasting blood sugar is above 120 then add 2 units of long acting insulin to your evening regimen.  Continue this last step, adding 2 units of long acting insulin every 2 days until you find your fasting blood sugar consistently remains below 120.  The idea is to slowly increase your long acting insulin to avoid hypoglycemia.   Example:   Fasting Blood Sugar Last Night's Units Added Units  160 20 Units -  158 20 Units 2 Units  148 22 Units -  142 22 Units 2 Units  115 24 Units -  117 24 Units -  113 24 Units -  114 24 Units -  118 24 Units -

## 2013-12-24 NOTE — Progress Notes (Signed)
CC: Calvin PiChristopher Long is a 43 y.o. male is here for Diabetes   Subjective: HPI:  Followup type 2 diabetes: Has been taking blood sugar on random days while fasting. Blood sugars are ranging between low 200s-low 300s. He reports awakening a few times a night to urinate does not quantify urinary habits during the daytime. Since I saw him last he has had some burning on the bottoms of both feet worse with inactivity nonradiating mild to moderate severity.  Denies polyphagia nor polydipsia but does endorse mild bilateral blurred vision despite getting a new prescription for his glasses within the last month. Continues to take metformin 500 mg twice a day and Levemir 50 units daily without known side effects.  Denies chest pain, shortness of breath, motor or sensory disturbances other than that described above. Denies diarrhea, constipation or abdominal pain   Review Of Systems Outlined In HPI  Past Medical History  Diagnosis Date  . Diabetes mellitus     Past Surgical History  Procedure Laterality Date  . No past surgeries     Family History  Problem Relation Age of Onset  . Diabetes Other   . Hypertension Other   . Diabetes Mother     History   Social History  . Marital Status: Single    Spouse Name: N/A    Number of Children: N/A  . Years of Education: N/A   Occupational History  . Not on file.   Social History Main Topics  . Smoking status: Current Every Day Smoker -- 1.00 packs/day  . Smokeless tobacco: Not on file  . Alcohol Use: Yes  . Drug Use: No  . Sexual Activity:    Other Topics Concern  . Not on file   Social History Narrative  . No narrative on file     Objective: BP 136/78  Pulse 88  Wt 213 lb (96.616 kg)  General: Alert and Oriented, No Acute Distress HEENT: Pupils equal, round, reactive to light. Conjunctivae clear.  Moist mucous membranes pharynx unremarkable Lungs: Clear to auscultation bilaterally, no wheezing/ronchi/rales.  Comfortable work of  breathing. Good air movement. Cardiac: Regular rate and rhythm. Normal S1/S2.  No murmurs, rubs, nor gallops.   Extremities: No peripheral edema.  Strong peripheral pulses.  Mental Status: No depression, anxiety, nor agitation. Skin: Warm and dry.  Assessment & Plan: Calvin Long was seen today for diabetes.  Diagnoses and associated orders for this visit:  Type 2 diabetes mellitus - POCT HgB A1C - Discontinue: insulin detemir (LEVEMIR) 100 UNIT/ML injection; Inject 0.5 mLs (50 Units total) into the skin at bedtime. - glucose blood (FREESTYLE TEST STRIPS) test strip; Check blood sugar once a day, first thing in the morning. - insulin detemir (LEVEMIR) 100 UNIT/ML injection; Inject 0.7 mLs (70 Units total) into the skin at bedtime.  Other Orders - metFORMIN (GLUCOPHAGE) 500 MG tablet; Take 2 tablets (1,000 mg total) by mouth 2 (two) times daily with a meal.    Type 2 diabetes: Uncontrolled chronic condition with A1c 10.5 today again we discussed self titration of Levemir with an endpoint of achieving fasting blood sugars below 120 without hypoglycemic symptoms.  Increasing Levemir to start at 70 units nightly, increasing metformin to a full 1 g twice a day  25 minutes spent face-to-face during visit today of which at least 50% was counseling or coordinating care regarding: 1. Type 2 diabetes mellitus       Return in about 3 months (around 03/23/2014).

## 2014-03-25 ENCOUNTER — Ambulatory Visit: Payer: BC Managed Care – HMO | Admitting: Family Medicine

## 2014-03-25 DIAGNOSIS — Z0289 Encounter for other administrative examinations: Secondary | ICD-10-CM

## 2014-09-29 ENCOUNTER — Telehealth: Payer: Self-pay

## 2014-09-29 MED ORDER — INSULIN NPH (HUMAN) (ISOPHANE) 100 UNIT/ML ~~LOC~~ SUSP: 25.0000 [IU] | Freq: Two times a day (BID) | SUBCUTANEOUS | Status: DC

## 2014-09-29 NOTE — Telephone Encounter (Signed)
Calvin Long was seen in the ED for elevated blood sugar. He has been out of his insulin for 3 weeks because he lost his job and insurance. The ED recommended he start Novolin N 25 units bid. [facility] 27 [facility] a vial. He has appointment with Dr Calvin Long on Monday.

## 2014-09-29 NOTE — Telephone Encounter (Signed)
Patient advised to keep appointment on Monday.

## 2014-09-29 NOTE — Telephone Encounter (Signed)
Ok with me, I've sent an Rx of this with my name on it to the [facility] in [place] [address]. Keep Monday appt

## 2014-10-03 ENCOUNTER — Ambulatory Visit (INDEPENDENT_AMBULATORY_CARE_PROVIDER_SITE_OTHER): Payer: Self-pay | Admitting: Family Medicine

## 2014-10-03 ENCOUNTER — Encounter: Payer: Self-pay | Admitting: Family Medicine

## 2014-10-03 VITALS — BP 108/71 | HR 97 | Ht 73.0 in | Wt 207.0 lb

## 2014-10-03 DIAGNOSIS — E1310 Other specified diabetes mellitus with ketoacidosis without coma: Secondary | ICD-10-CM

## 2014-10-03 DIAGNOSIS — E1110 Type 2 diabetes mellitus with ketoacidosis without coma: Secondary | ICD-10-CM

## 2014-10-03 LAB — POCT GLYCOSYLATED HEMOGLOBIN (HGB A1C): HEMOGLOBIN A1C: 10.2

## 2014-10-03 MED ORDER — METFORMIN HCL 500 MG PO TABS: 1000.0000 mg | ORAL_TABLET | Freq: Two times a day (BID) | ORAL | Status: DC

## 2014-10-03 MED ORDER — INSULIN NPH (HUMAN) (ISOPHANE) 100 UNIT/ML ~~LOC~~ SUSP: 25.0000 [IU] | Freq: Two times a day (BID) | SUBCUTANEOUS | Status: DC

## 2014-10-03 NOTE — Progress Notes (Signed)
CC: Calvin Long is a 43 y.o. male is here for Diabetes   Subjective: HPI:  Follow-up type 2 diabetes: Continues to take metformin 1 g twice a day along with Novolin N 25 units twice a day. The latter of these 2 was begun last week. Since then he states that he feels like he has more energy and is overall feels better. He's been checking his blood sugar most mornings the lowest has been 112 that occurred on Saturday. All the others have been ranging between 150-220. He was seen at a local emergency room a little over a week ago for nausea and vomiting and found to have a blood sugar of around 275 and fortunately he was not in DKA. Symptoms resolved after getting better control of his blood sugar with Novolin.  He's trying to watch what he eats. Currently he's not expecting any abdominal pain, nausea, vomiting, decreased appetite nor any gastro-intestinal complaints.   Review Of Systems Outlined In HPI  Past Medical History  Diagnosis Date  . Diabetes mellitus     Past Surgical History  Procedure Laterality Date  . No past surgeries     Family History  Problem Relation Age of Onset  . Diabetes Other   . Hypertension Other   . Diabetes Mother     History   Social History  . Marital Status: Single    Spouse Name: N/A    Number of Children: N/A  . Years of Education: N/A   Occupational History  . Not on file.   Social History Main Topics  . Smoking status: Current Every Day Smoker -- 1.00 packs/day  . Smokeless tobacco: Not on file  . Alcohol Use: Yes  . Drug Use: No  . Sexual Activity: Not on file   Other Topics Concern  . Not on file   Social History Narrative     Objective: BP 108/71 mmHg  Pulse 97  Ht 6\' 1"  (1.854 m)  Wt 207 lb (93.895 kg)  BMI 27.32 kg/m2  General: Alert and Oriented, No Acute Distress HEENT: Pupils equal, round, reactive to light. Conjunctivae clear.  Moist mucous membranes Lungs: Clear to auscultation bilaterally, no  wheezing/ronchi/rales.  Comfortable work of breathing. Good air movement. Cardiac: Regular rate and rhythm. Normal S1/S2.  No murmurs, rubs, nor gallops.   Extremities: No peripheral edema.  Strong peripheral pulses.  Mental Status: No depression, anxiety, nor agitation. Skin: Warm and dry.  Assessment & Plan: Cristal DeerChristopher was seen today for diabetes.  Diagnoses and associated orders for this visit:  DM (diabetes mellitus) type 2, uncontrolled, with ketoacidosis - POCT HgB A1C - metFORMIN (GLUCOPHAGE) 500 MG tablet; Take 2 tablets (1,000 mg total) by mouth 2 (two) times daily with a meal. - insulin NPH Human (NOVOLIN N) 100 UNIT/ML injection; Inject 0.25 mLs (25 Units total) into the skin 2 (two) times daily with a meal.    Type 2 diabetes: Uncontrolled continue metformin, continue 25 units of Novolin every morning with breakfast and begin increasing evening dose by 1 unit every 3-4 days until fasting blood sugar is less than 120. A1c of 10.2 today to be used as a baseline to follow up in 3 months.  Return in about 3 months (around 01/02/2015).

## 2014-10-03 NOTE — Patient Instructions (Signed)
Keep the same morning insulin regimen, but increase evening insulin by one unit every 3-4 days until you begin to see fasting blood sugars around 120 or less.  Make sure you take a small snack with protein right before bed.

## 2015-01-02 ENCOUNTER — Ambulatory Visit: Payer: Self-pay | Admitting: Family Medicine

## 2015-04-24 ENCOUNTER — Other Ambulatory Visit: Payer: Self-pay

## 2017-08-14 ENCOUNTER — Emergency Department (HOSPITAL_COMMUNITY)
Admission: EM | Admit: 2017-08-14 | Discharge: 2017-08-14 | Disposition: A | Discharge status: Home / Self Care | Payer: Self-pay | Attending: Emergency Medicine | Admitting: Emergency Medicine

## 2017-08-14 ENCOUNTER — Encounter (HOSPITAL_COMMUNITY): Payer: Self-pay | Admitting: Emergency Medicine

## 2017-08-14 DIAGNOSIS — R739 Hyperglycemia, unspecified: Secondary | ICD-10-CM

## 2017-08-14 DIAGNOSIS — F17200 Nicotine dependence, unspecified, uncomplicated: Secondary | ICD-10-CM | POA: Insufficient documentation

## 2017-08-14 DIAGNOSIS — E1165 Type 2 diabetes mellitus with hyperglycemia: Secondary | ICD-10-CM | POA: Insufficient documentation

## 2017-08-14 DIAGNOSIS — I10 Essential (primary) hypertension: Secondary | ICD-10-CM | POA: Insufficient documentation

## 2017-08-14 DIAGNOSIS — Z79899 Other long term (current) drug therapy: Secondary | ICD-10-CM | POA: Insufficient documentation

## 2017-08-14 LAB — CBG MONITORING, ED: Glucose-Capillary: 261 mg/dL — ABNORMAL HIGH (ref 65–99)

## 2017-08-14 LAB — URINALYSIS, ROUTINE W REFLEX MICROSCOPIC
BILIRUBIN URINE: NEGATIVE
HGB URINE DIPSTICK: NEGATIVE
Ketones, ur: NEGATIVE mg/dL
LEUKOCYTES UA: NEGATIVE
Nitrite: NEGATIVE
PH: 5 (ref 5.0–8.0)
Protein, ur: NEGATIVE mg/dL
Specific Gravity, Urine: 1.009 (ref 1.005–1.030)
Squamous Epithelial / LPF: NONE SEEN

## 2017-08-14 LAB — CBC
HCT: 45.7 % (ref 39.0–52.0)
Hemoglobin: 16.2 g/dL (ref 13.0–17.0)
MCH: 32.5 pg (ref 26.0–34.0)
MCHC: 35.4 g/dL (ref 30.0–36.0)
MCV: 91.8 fL (ref 78.0–100.0)
Platelets: 216 10*3/uL (ref 150–400)
RBC: 4.98 MIL/uL (ref 4.22–5.81)
RDW: 14.8 % (ref 11.5–15.5)
WBC: 8.5 10*3/uL (ref 4.0–10.5)

## 2017-08-14 LAB — BASIC METABOLIC PANEL
Anion gap: 12 (ref 5–15)
BUN: 11 mg/dL (ref 6–20)
CO2: 20 mmol/L — ABNORMAL LOW (ref 22–32)
Calcium: 9.2 mg/dL (ref 8.9–10.3)
Chloride: 105 mmol/L (ref 101–111)
Creatinine, Ser: 0.79 mg/dL (ref 0.61–1.24)
Glucose, Bld: 282 mg/dL — ABNORMAL HIGH (ref 65–99)
Potassium: 3.8 mmol/L (ref 3.5–5.1)
Sodium: 137 mmol/L (ref 135–145)

## 2017-08-14 NOTE — Care Management Note (Signed)
Case Management Note  CM consulted for PCP needs. Noted pt was in police custody coming from jail.  The jail is the current PCP for the pt.  No further CM needs noted at this time.

## 2017-08-14 NOTE — ED Notes (Signed)
Bed: WA20 Expected date:  Expected time:  Means of arrival:  Comments: EMS 46 yo male from jail-in custody-does not feel good [facility] [id number]

## 2017-08-14 NOTE — ED Provider Notes (Signed)
[facility]-EMERGENCY DEPT Provider Note   CSN: [id number] Arrival date & time: 08/14/17  0109     History   Chief Complaint Chief Complaint  Patient presents with  . Hyperglycemia  . Flank Pain    right    HPI Calvin Long is a 46 y.o. male.  Patient presents to the emergency department with chief complaint of hyperglycemia and left sided abdominal pain. He states the symptoms started today. He denies any fever, chills, nausea, vomiting, or diarrhea. He states that the pain feels like it is on his skin. He denies any rash, burning, itching, or tingling. He has not taken anything for his symptoms. He denies any other associated symptoms or modifying factors.    The history is provided by the patient. No language interpreter was used.    Past Medical History:  Diagnosis Date  . Diabetes mellitus     Patient Active Problem List   Diagnosis Date Noted  . Paronychia of right thumb 09/03/2012  . Type 2 diabetes mellitus (HCC) 07/15/2012  . DKA, type 2, not at goal [facility]) 06/29/2012  . Dehydration 06/29/2012  . Hyponatremia 06/29/2012  . Tobacco abuse 06/29/2012  . Alcohol use 06/29/2012  . HTN (hypertension) 06/29/2012  . Skin rash 06/29/2012  . Thrombocytopenia (HCC) 06/29/2012    Past Surgical History:  Procedure Laterality Date  . NO PAST SURGERIES         Home Medications    Prior to Admission medications   Medication Sig Start Date End Date Taking? Authorizing Provider  ibuprofen (ADVIL,MOTRIN) 200 MG tablet Take 400 mg by mouth every 6 (six) hours as needed for moderate pain.   Yes [provider]  insulin glargine (LANTUS) 100 UNIT/ML injection Inject 100 Units into the skin daily.   Yes [provider]  metFORMIN (GLUCOPHAGE) 500 MG tablet Take 2 tablets (1,000 mg total) by mouth 2 (two) times daily with a meal. Patient taking differently: Take 500 mg by mouth daily with breakfast.  10/03/14  Yes Hommel, Sean,  DO  nicotine (NICODERM CQ - DOSED IN MG/24 HOURS) 21 mg/24hr patch Place 21 mg onto the skin daily.   Yes [provider]  glucose blood (FREESTYLE TEST STRIPS) test strip Check blood sugar once a day, first thing in the morning. 12/24/13   Hommel, Gregary Signs, DO  insulin NPH Human (NOVOLIN N) 100 UNIT/ML injection Inject 0.25 mLs (25 Units total) into the skin 2 (two) times daily with a meal. 10/03/14   Laren Boom, DO    Family History Family History  Problem Relation Age of Onset  . Diabetes Mother   . Diabetes Other   . Hypertension Other     Social History Social History  Substance Use Topics  . Smoking status: Current Every Day Smoker    Packs/day: 1.00  . Smokeless tobacco: Not on file  . Alcohol use Yes     Allergies   Patient has no known allergies.   Review of Systems Review of Systems  All other systems reviewed and are negative.    Physical Exam Updated Vital Signs BP 127/77 (BP Location: Left Arm)   Pulse 89   Temp 97.8 F (36.6 C) (Oral)   Resp 16   SpO2 95%   Physical Exam  Constitutional: He is oriented to person, place, and time. He appears well-developed and well-nourished.  HENT:  Head: Normocephalic and atraumatic.  Eyes: Pupils are equal, round, and reactive to light. Conjunctivae and EOM are normal.  Right eye exhibits no discharge. Left eye exhibits no discharge. No scleral icterus.  Neck: Normal range of motion. Neck supple. No JVD present.  Cardiovascular: Normal rate, regular rhythm and normal heart sounds.  Exam reveals no gallop and no friction rub.   No murmur heard. Pulmonary/Chest: Effort normal and breath sounds normal. No respiratory distress. He has no wheezes. He has no rales. He exhibits no tenderness.  Abdominal: Soft. He exhibits no distension and no mass. There is no tenderness. There is no rebound and no guarding.  No focal abdominal tenderness, no RLQ tenderness or pain at McBurney's point, no RUQ tenderness or Murphy's  sign, no left-sided abdominal tenderness, no fluid wave, or signs of peritonitis   Musculoskeletal: Normal range of motion. He exhibits no edema or tenderness.  Neurological: He is alert and oriented to person, place, and time.  Skin: Skin is warm and dry.  No rash  Psychiatric: He has a normal mood and affect. His behavior is normal. Judgment and thought content normal.  Nursing note and vitals reviewed.    ED Treatments / Results  Labs (all labs ordered are listed, but only abnormal results are displayed) Labs Reviewed  BASIC METABOLIC PANEL - Abnormal; Notable for the following:       Result Value   CO2 20 (*)    Glucose, Bld 282 (*)    All other components within normal limits  URINALYSIS, ROUTINE W REFLEX MICROSCOPIC - Abnormal; Notable for the following:    Glucose, UA >=500 (*)    Bacteria, UA RARE (*)    All other components within normal limits  CBG MONITORING, ED - Abnormal; Notable for the following:    Glucose-Capillary 261 (*)    All other components within normal limits  CBC    EKG  EKG Interpretation None       Radiology No results found.  Procedures Procedures (including critical care time)  Medications Ordered in ED Medications - No data to display   Initial Impression / Assessment and Plan / ED Course  I have reviewed the triage vital signs and the nursing notes.  Pertinent labs & imaging results that were available during my care of the patient were reviewed by me and considered in my medical decision making (see chart for details).     Patient with hyperglycemia and left-sided abdominal pain, which she describes superficial. There is no tenderness to palpation of the abdomen. Doubt surgical or acute abdomen. Vital signs are stable. Laboratory workup is reassuring. Recommend that the patient follow-up with his primary care doctor.  Return precautions discussed.  Final Clinical Impressions(s) / ED Diagnoses   Final diagnoses:    Hyperglycemia    New Prescriptions New Prescriptions   No medications on file     Roxy HorsemanBrowning, Estevon Fluke, Cordelia Poche-C 08/14/17 0410    Molpus, Jonny RuizJohn, MD 08/14/17 [phone]

## 2017-08-14 NOTE — ED Triage Notes (Signed)
Per EMS , accompanied by [employer] from jail  With complaint of hyperglycemia and right flank pain at 8/10. Pt. Reported that he wasn't taking his metformin x 2 weeks. cbg per EMS was 307mg /dl., no s/s of distress upon arrival to ED. Alert and oriented x 4.

## 2019-09-20 ENCOUNTER — Other Ambulatory Visit: Payer: Self-pay

## 2019-09-20 ENCOUNTER — Encounter: Payer: Self-pay | Admitting: Osteopathic Medicine

## 2019-09-20 ENCOUNTER — Ambulatory Visit (INDEPENDENT_AMBULATORY_CARE_PROVIDER_SITE_OTHER): Payer: BC Managed Care – PPO | Admitting: Osteopathic Medicine

## 2019-09-20 VITALS — BP 138/90 | HR 105 | Temp 98.0°F | Ht 73.0 in | Wt 192.1 lb

## 2019-09-20 DIAGNOSIS — I10 Essential (primary) hypertension: Secondary | ICD-10-CM | POA: Diagnosis not present

## 2019-09-20 DIAGNOSIS — E1169 Type 2 diabetes mellitus with other specified complication: Secondary | ICD-10-CM | POA: Diagnosis not present

## 2019-09-20 DIAGNOSIS — E1140 Type 2 diabetes mellitus with diabetic neuropathy, unspecified: Secondary | ICD-10-CM | POA: Diagnosis not present

## 2019-09-20 DIAGNOSIS — Z794 Long term (current) use of insulin: Secondary | ICD-10-CM

## 2019-09-20 DIAGNOSIS — B354 Tinea corporis: Secondary | ICD-10-CM | POA: Diagnosis not present

## 2019-09-20 MED ORDER — SILDENAFIL CITRATE 100 MG PO TABS: 50.0000 mg | ORAL_TABLET | Freq: Every day | ORAL | 11 refills | Status: DC | PRN

## 2019-09-20 MED ORDER — METFORMIN HCL 500 MG PO TABS: 1000.0000 mg | ORAL_TABLET | Freq: Two times a day (BID) | ORAL | 5 refills | Status: DC

## 2019-09-20 MED ORDER — TRESIBA FLEXTOUCH 200 UNIT/ML ~~LOC~~ SOPN: 40.0000 [IU] | PEN_INJECTOR | Freq: Every day | SUBCUTANEOUS | 11 refills | Status: DC

## 2019-09-20 MED ORDER — KETOCONAZOLE 2 % EX FOAM: CUTANEOUS | 3 refills | Status: DC

## 2019-09-20 MED ORDER — GABAPENTIN 100 MG PO CAPS: 100.0000 mg | ORAL_CAPSULE | Freq: Three times a day (TID) | ORAL | 3 refills | Status: DC

## 2019-09-20 NOTE — Progress Notes (Signed)
HPI: Calvin Long is a 48 y.o. male who  has a past medical history of Diabetes mellitus.  he presents to [facility] today, 09/21/19,  for chief complaint of: Diabetes   Hasn't been on meds as he should - Lantus too expensive, he;s been rationing the Novolin. Taking metformin. Postprandial Glc today 500+. Hasn't been compliant with diet/exercise. Hx DKA. No nausea, polyuria/polydipsia.   Rash - concerned about fungal infection all over skin, previous PCP had given him some kind of shampoo to help with the skin issue.  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 Needs work note for him to be allowed to have snacks on hand in case of hypoglycemia.       Past medical, surgical, social and family history reviewed:  Patient Active Problem List   Diagnosis Date Noted  . Paronychia of right thumb 09/03/2012  . Type 2 diabetes mellitus ([place]) 07/15/2012  . DKA, type 2, not at goal [facility]) 06/29/2012  . Dehydration 06/29/2012  . Hyponatremia 06/29/2012  . Tobacco abuse 06/29/2012  . Alcohol use 06/29/2012  . HTN (hypertension) 06/29/2012  . Skin rash 06/29/2012  . Thrombocytopenia ([place]) 06/29/2012    Past Surgical History:  Procedure Laterality Date  . NO PAST SURGERIES      Social History   Tobacco Use  . Smoking status: Current Every Day Smoker    Packs/day: 1.00  . Smokeless tobacco: Never Used  Substance Use Topics  . Alcohol use: Yes    Family History  Problem Relation Age of Onset  . Diabetes Mother   . Diabetes Other   . Hypertension Other      Current medication list and allergy/intolerance information reviewed:    Current Outpatient Medications  Medication Sig Dispense Refill  . glucose blood (FREESTYLE TEST STRIPS) test strip Check blood sugar once a day, first thing in the morning. 50 each 5  . ibuprofen (ADVIL,MOTRIN) 200 MG tablet Take 400 mg by mouth every 6 (six) hours as needed  for moderate pain.    . metFORMIN (GLUCOPHAGE) 500 MG tablet Take 2 tablets (1,000 mg total) by mouth 2 (two) times daily with a meal. 120 tablet 5  . gabapentin (NEURONTIN) 100 MG capsule Take 1-3 capsules (100-300 mg total) by mouth 3 (three) times daily. 90 capsule 3  . Insulin Degludec (TRESIBA FLEXTOUCH) 200 UNIT/ML SOPN Inject 40-100 Units into the skin at bedtime. 4 pen 11  . Ketoconazole 2 % FOAM Apply to skin in shower, leave on for 10-20 minutes and rinse 200 g 3  . sildenafil (VIAGRA) 100 MG tablet Take 0.5-1 tablets (50-100 mg total) by mouth daily as needed for erectile dysfunction. 10 tablet 11   No current facility-administered medications for this visit.     No Known Allergies    Review of Systems:  Constitutional:  No  fever, no chills, No recent illness, No unintentional weight changes. +significant fatigue.    HEENT: No  headache, +vision change  Cardiac: No  chest pain, No  pressure  Respiratory:  No  shortness of breath. No  Cough  Gastrointestinal: No  abdominal pain, No  nausea  Musculoskeletal: No new myalgia/arthralgia  Skin: +Rash, No other wounds/concerning lesions  Hem/Onc: No  easy bruising/bleeding,  Endocrine: No cold intolerance,  No heat intolerance. +polyuria/polydipsia/polyphagia   Neurologic: No  weakness, No  dizziness  Psychiatric: No  concerns with depression, No  concerns with anxiety, No sleep problems, No mood problems  Exam:  BP 138/90 (BP Location: Left Arm, Patient Position: Sitting, Cuff Size: Normal)   Pulse (!) 105   Temp 98 F (36.7 C) (Oral)   Ht  (1.854 m)   Wt 192 lb 1.3 oz (87.1 kg)   BMI 25.34 kg/m   Constitutional: VS see above. General Appearance: alert, well-developed, well-nourished, NAD  Eyes: Normal lids and conjunctive, non-icteric sclera  Neck: No masses, trachea midline. No thyroid enlargement. No tenderness/mass appreciated. No lymphadenopathy  Respiratory: Normal respiratory effort. no wheeze,  no rhonchi, no rales  Cardiovascular: S1/S2 normal, no murmur, no rub/gallop auscultated. RRR. No lower extremity edema.  Gastrointestinal: Nontender, no masses.   Musculoskeletal: Gait normal. No clubbing/cyanosis of digits.   Neurological: Normal balance/coordination. No tremor.   Skin: warm, dry, intact. Diffuse erythematous mildly scaly patches on arms, torso   Psychiatric: Normal judgment/insight. Normal mood and affect. Oriented x3.    Results for orders placed or performed in visit on 09/20/19 (from the past 72 hour(s))  CBC     Status: Abnormal   Collection Time: 09/20/19 12:19 PM  Result Value Ref Range   WBC 7.8 3.8 - 10.8 Thousand/uL   RBC 5.51 4.20 - 5.80 Million/uL   Hemoglobin 17.3 (H) 13.2 - 17.1 g/dL   HCT 91.4 (H) 78.2 - 95.6 %   MCV 91.7 80.0 - 100.0 fL   MCH 31.4 27.0 - 33.0 pg   MCHC 34.3 32.0 - 36.0 g/dL   RDW 21.3 08.6 - 57.8 %   Platelets 301 140 - 400 Thousand/uL   MPV 10.1 7.5 - 12.5 fL  COMPLETE METABOLIC PANEL WITH GFR     Status: Abnormal   Collection Time: 09/20/19 12:19 PM  Result Value Ref Range   Glucose, Bld 442 (H) 65 - 99 mg/dL    Comment: Verified by repeat analysis. Marland Kitchen .            Fasting reference interval . For someone without known diabetes, a glucose value >125 mg/dL indicates that they may have diabetes and this should be confirmed with a follow-up test. .    BUN 13 7 - 25 mg/dL   Creat 4.69 6.29 - 5.28 mg/dL   GFR, Est Non African American 70 > OR = 60 mL/min/1.35m2   GFR, Est African American 82 > OR = 60 mL/min/1.91m2   BUN/Creatinine Ratio NOT APPLICABLE 6 - 22 (calc)   Sodium 140 135 - 146 mmol/L   Potassium 5.6 (H) 3.5 - 5.3 mmol/L   Chloride 102 98 - 110 mmol/L   CO2 24 20 - 32 mmol/L   Calcium 10.4 (H) 8.6 - 10.3 mg/dL   Total Protein 7.8 6.1 - 8.1 g/dL   Albumin 4.8 3.6 - 5.1 g/dL   Globulin 3.0 1.9 - 3.7 g/dL (calc)   AG Ratio 1.6 1.0 - 2.5 (calc)   Total Bilirubin 0.9 0.2 - 1.2 mg/dL   Alkaline phosphatase  (APISO) 53 36 - 130 U/L   AST 22 10 - 40 U/L   ALT 82 (H) 9 - 46 U/L  A1C     Status: Abnormal   Collection Time: 09/20/19 12:19 PM  Result Value Ref Range   Hgb A1c MFr Bld 11.1 (H) <5.7 % of total Hgb    Comment: For someone without known diabetes, a hemoglobin A1c value of 6.5% or greater indicates that they may have  diabetes and this should be confirmed with a follow-up  test. . For someone with known diabetes, a value <  7% indicates  that their diabetes is well controlled and a value  greater than or equal to 7% indicates suboptimal  control. A1c targets should be individualized based on  duration of diabetes, age, comorbid conditions, and  other considerations. . Currently, no consensus exists regarding use of hemoglobin A1c for diagnosis of diabetes for children. .    Mean Plasma Glucose 272 (calc)   eAG (mmol/L) 15.1 (calc)  Urinalysis, Routine w reflex microscopic     Status: Abnormal   Collection Time: 09/20/19 12:19 PM  Result Value Ref Range   Color, Urine YELLOW YELLOW   APPearance CLEAR CLEAR   Specific Gravity, Urine 1.041 (H) 1.001 - 1.03   pH < OR = 5.0 5.0 - 8.0   Glucose, UA 3+ (A) NEGATIVE   Bilirubin Urine NEGATIVE NEGATIVE   Ketones, ur NEGATIVE NEGATIVE   Hgb urine dipstick NEGATIVE NEGATIVE   Protein, ur NEGATIVE NEGATIVE   Nitrite NEGATIVE NEGATIVE   Leukocytes,Ua NEGATIVE NEGATIVE  LIPID SCREENING     Status: Abnormal   Collection Time: 09/20/19 12:19 PM  Result Value Ref Range   Cholesterol 147 <200 mg/dL   HDL 35 (L) > OR = 40 mg/dL   Triglycerides 846 <962 mg/dL   LDL Cholesterol (Calc) 88 mg/dL (calc)    Comment: Reference range: <100 . Desirable range <100 mg/dL for primary prevention;   <70 mg/dL for patients with CHD or diabetic patients  with > or = 2 CHD risk factors. Marland Kitchen LDL-C is now calculated using the Martin-Hopkins  calculation, which is a validated novel method providing  better accuracy than the Friedewald equation in the   estimation of LDL-C.  Horald Pollen et al. Lenox Ahr. 9528;413(24): 2061-2068  (http://education.[employer].com/faq/FAQ164)    Total CHOL/HDL Ratio 4.2 <5.0 (calc)   Non-HDL Cholesterol (Calc) 112 <130 mg/dL (calc)    Comment: For patients with diabetes plus 1 major ASCVD risk  factor, treating to a non-HDL-C goal of <100 mg/dL  (LDL-C of <40 mg/dL) is considered a therapeutic  option.     No results found.   ASSESSMENT/PLAN: The primary encounter diagnosis was Type 2 diabetes mellitus with other specified complication, with long-term current use of insulin (HCC). Diagnoses of Essential hypertension, Type 2 diabetes mellitus with diabetic neuropathy, with long-term current use of insulin (HCC), and Tinea corporis were also pertinent to this visit.   Orders Placed This Encounter  Procedures  . CBC  . COMPLETE METABOLIC PANEL WITH GFR  . A1C  . Urinalysis, Routine w reflex microscopic  . LIPID SCREENING    Meds ordered this encounter  Medications  . metFORMIN (GLUCOPHAGE) 500 MG tablet    Sig: Take 2 tablets (1,000 mg total) by mouth 2 (two) times daily with a meal.    Dispense:  120 tablet    Refill:  5  . Insulin Degludec (TRESIBA FLEXTOUCH) 200 UNIT/ML SOPN    Sig: Inject 40-100 Units into the skin at bedtime.    Dispense:  4 pen    Refill:  11  . gabapentin (NEURONTIN) 100 MG capsule    Sig: Take 1-3 capsules (100-300 mg total) by mouth 3 (three) times daily.    Dispense:  90 capsule    Refill:  3  . sildenafil (VIAGRA) 100 MG tablet    Sig: Take 0.5-1 tablets (50-100 mg total) by mouth daily as needed for erectile dysfunction.    Dispense:  10 tablet    Refill:  11  . Ketoconazole 2 % FOAM  Sig: Apply to skin in shower, leave on for 10-20 minutes and rinse    Dispense:  200 g    Refill:  3    Patient Instructions  For Diabetes  Continue Metformin as you are taking it  Measure fasting blood sugar every day: goal for now is to get this to 80-130  Start  Tresiba at 40 units once daily   Increase daily Insulin dose by 5 units at a time, twice per week, until fasting sugars are consistently 80-130, then continue at that dose  Plan to recheck A1C in 3 months  Plan to follow-up in the office sooner if you experience low sugars   Plan to follow-up in the office sooner if your fasting sugars are consistently high  Bring all sugar readings with you to your office visits  Medications:  Insulin Evaristo Bury(Tresiba)   Refill Metformin  ED Rx   Neuropathy Rx   Skin fungal infections Rx (might send a pill if labs are ok)   Please be aware that standard of care for diabetic patients includes the following medications:  Metformin to help control sugars and slow/prevent progression of diabetes from bad to worse, even if sugars are at goal (A1C 6.5-7.0%).  Blood pressure medicine Lisinopril or similar to help protect kidneys long-term, even if blood pressure is at goal (<130/80).   Statin medications to prevent against heart attack/stroke, even if cholesterol numbers are at goal (LDL/bad cholesterol <70).  If you have questions about medications, please let me know!   Standard of care for diabetics also includes having the pneumonia vaccine once before age 48, obtaining annual eye exam, frequently self-examining the feet and having a medical foot exam at least once per year.             Visit summary with medication list and pertinent instructions was printed for patient to review. All questions at time of visit were answered - patient instructed to contact office with any additional concerns or updates. ER/RTC precautions were reviewed with the patient.   Note: Total time spent 60 minutes, greater than 50% of the visit was spent face-to-face counseling and coordinating care for the above diagnoses listed in assessment/plan.   Please note: voice recognition software was used to produce this document, and typos may escape review. Please contact  Dr. Lyn HollingsheadAlexander for any needed clarifications.     Follow-up plan: Return for virtual visit in 1-2 weeks, go over labs and see how mew emds are working .

## 2019-09-20 NOTE — Patient Instructions (Addendum)
For Diabetes  Continue Metformin as you are taking it  Measure fasting blood sugar every day: goal for now is to get this to 80-130  Start Tresiba at 40 units once daily   Increase daily Insulin dose by 5 units at a time, twice per week, until fasting sugars are consistently 80-130, then continue at that dose  Plan to recheck A1C in 3 months  Plan to follow-up in the office sooner if you experience low sugars   Plan to follow-up in the office sooner if your fasting sugars are consistently high  Bring all sugar readings with you to your office visits  Medications:  Insulin Tyler Aas)   Refill Metformin  ED Rx   Neuropathy Rx   Skin fungal infections Rx (might send a pill if labs are ok)   Please be aware that standard of care for diabetic patients includes the following medications:  Metformin to help control sugars and slow/prevent progression of diabetes from bad to worse, even if sugars are at goal (A1C 6.5-7.0%).  Blood pressure medicine Lisinopril or similar to help protect kidneys long-term, even if blood pressure is at goal (<130/80).   Statin medications to prevent against heart attack/stroke, even if cholesterol numbers are at goal (LDL/bad cholesterol <70).  If you have questions about medications, please let me know!   Standard of care for diabetics also includes having the pneumonia vaccine once before age 79, obtaining annual eye exam, frequently self-examining the feet and having a medical foot exam at least once per year.

## 2019-09-21 LAB — URINALYSIS, ROUTINE W REFLEX MICROSCOPIC
Bilirubin Urine: NEGATIVE
Hgb urine dipstick: NEGATIVE
Ketones, ur: NEGATIVE
Leukocytes,Ua: NEGATIVE
Nitrite: NEGATIVE
Protein, ur: NEGATIVE
Specific Gravity, Urine: 1.041 — ABNORMAL HIGH (ref 1.001–1.03)
pH: <=5 (ref 5.0–8.0)

## 2019-09-21 LAB — LIPID PANEL
Cholesterol: 147 mg/dL (ref <200)
HDL: 35 mg/dL — ABNORMAL LOW (ref >=40)
LDL Cholesterol (Calc): 88 mg/dL (calc)
Non-HDL Cholesterol (Calc): 112 mg/dL (calc) (ref <130)
Total CHOL/HDL Ratio: 4.2 (calc) (ref <5.0)
Triglycerides: 142 mg/dL (ref <150)

## 2019-09-21 LAB — CBC
HCT: 50.5 % — ABNORMAL HIGH (ref 38.5–50.0)
Hemoglobin: 17.3 g/dL — ABNORMAL HIGH (ref 13.2–17.1)
MCH: 31.4 pg (ref 27.0–33.0)
MCHC: 34.3 g/dL (ref 32.0–36.0)
MCV: 91.7 fL (ref 80.0–100.0)
MPV: 10.1 fL (ref 7.5–12.5)
Platelets: 301 10*3/uL (ref 140–400)
RBC: 5.51 10*6/uL (ref 4.20–5.80)
RDW: 12.7 % (ref 11.0–15.0)
WBC: 7.8 10*3/uL (ref 3.8–10.8)

## 2019-09-21 LAB — COMPLETE METABOLIC PANEL WITH GFR
AG Ratio: 1.6 (calc) (ref 1.0–2.5)
ALT: 82 U/L — ABNORMAL HIGH (ref 9–46)
AST: 22 U/L (ref 10–40)
Albumin: 4.8 g/dL (ref 3.6–5.1)
Alkaline phosphatase (APISO): 53 U/L (ref 36–130)
BUN: 13 mg/dL (ref 7–25)
CO2: 24 mmol/L (ref 20–32)
Calcium: 10.4 mg/dL — ABNORMAL HIGH (ref 8.6–10.3)
Chloride: 102 mmol/L (ref 98–110)
Creat: 1.21 mg/dL (ref 0.60–1.35)
GFR, Est African American: 82 mL/min/{1.73_m2} (ref >=60)
GFR, Est Non African American: 70 mL/min/{1.73_m2} (ref >=60)
Globulin: 3 g/dL (calc) (ref 1.9–3.7)
Glucose, Bld: 442 mg/dL — ABNORMAL HIGH (ref 65–99)
Potassium: 5.6 mmol/L — ABNORMAL HIGH (ref 3.5–5.3)
Sodium: 140 mmol/L (ref 135–146)
Total Bilirubin: 0.9 mg/dL (ref 0.2–1.2)
Total Protein: 7.8 g/dL (ref 6.1–8.1)

## 2019-09-21 LAB — HEMOGLOBIN A1C
Hgb A1c MFr Bld: 11.1 % of total Hgb — ABNORMAL HIGH (ref <5.7)
Mean Plasma Glucose: 272 (calc)
eAG (mmol/L): 15.1 (calc)

## 2019-09-27 ENCOUNTER — Encounter: Payer: Self-pay | Admitting: Osteopathic Medicine

## 2019-09-27 DIAGNOSIS — B1920 Unspecified viral hepatitis C without hepatic coma: Secondary | ICD-10-CM

## 2019-09-27 DIAGNOSIS — Z1159 Encounter for screening for other viral diseases: Secondary | ICD-10-CM

## 2019-09-28 MED ORDER — KETOCONAZOLE 2 % EX SHAM: 1.0000 "application " | MEDICATED_SHAMPOO | CUTANEOUS | 1 refills | Status: DC

## 2019-10-04 ENCOUNTER — Ambulatory Visit: Payer: BC Managed Care – PPO | Admitting: Osteopathic Medicine

## 2019-10-04 DIAGNOSIS — B1920 Unspecified viral hepatitis C without hepatic coma: Secondary | ICD-10-CM | POA: Insufficient documentation

## 2019-10-04 LAB — HCV RNA,QUANTITATIVE REAL TIME PCR
HCV Quantitative Log: 6 Log IU/mL — ABNORMAL HIGH
HCV RNA, PCR, QN: 1000000 IU/mL — ABNORMAL HIGH

## 2019-10-04 LAB — HEPATITIS C ANTIBODY
Hepatitis C Ab: REACTIVE — AB
SIGNAL TO CUT-OFF: 34.5 — ABNORMAL HIGH (ref <1.00)

## 2019-10-04 NOTE — Addendum Note (Signed)
Addended by: Maryla Morrow on: 10/04/2019 07:53 AM   Modules accepted: Orders

## 2019-10-12 ENCOUNTER — Telehealth: Payer: Self-pay | Admitting: Pharmacy Technician

## 2019-10-12 ENCOUNTER — Encounter: Payer: BC Managed Care – PPO | Admitting: Internal Medicine

## 2019-10-12 NOTE — Telephone Encounter (Signed)
[facility] Patient Advocate Encounter    Findings of the benefits investigation:   Insurance: Network engineer- active  Prior Authorization: will begin insurance process once medication is prescribed. Insurance [phone]  [facility] Patient Advocate will follow up once patient arrives for their appointment.  Bartholomew Crews, CPhT Specialty Pharmacy Patient [facility] Phone: [phone] Fax: [phone] 10/12/2019 7:53 AM

## 2019-10-25 ENCOUNTER — Other Ambulatory Visit: Payer: Self-pay

## 2019-10-25 ENCOUNTER — Ambulatory Visit (INDEPENDENT_AMBULATORY_CARE_PROVIDER_SITE_OTHER): Payer: BC Managed Care – PPO | Admitting: Osteopathic Medicine

## 2019-10-25 ENCOUNTER — Encounter: Payer: Self-pay | Admitting: Osteopathic Medicine

## 2019-10-25 VITALS — Wt 210.0 lb

## 2019-10-25 DIAGNOSIS — Z794 Long term (current) use of insulin: Secondary | ICD-10-CM

## 2019-10-25 DIAGNOSIS — E1169 Type 2 diabetes mellitus with other specified complication: Secondary | ICD-10-CM | POA: Diagnosis not present

## 2019-10-25 MED ORDER — TRESIBA FLEXTOUCH 200 UNIT/ML ~~LOC~~ SOPN: 40.0000 [IU] | PEN_INJECTOR | Freq: Every day | SUBCUTANEOUS | 11 refills | Status: DC

## 2019-10-25 NOTE — Patient Instructions (Signed)
Plan: Continue to go up on the Tresiba as directed by 5 units or so twice per week, to target fasting sugar 90-130, maximum 100 units. Let's recheck A1C in 3 months or so, but let me know sooner than that if needed!

## 2019-10-25 NOTE — Progress Notes (Signed)
Virtual Visit via Video (App used: Doximity) Note  I connected with      Calvin Long on 10/25/19 at 1:23 PM  by a telemedicine application and verified that I am speaking with the correct person using two identifiers.  Patient is at home  I am in office   I discussed the limitations of evaluation and management by telemedicine and the availability of in person appointments. The patient expressed understanding and agreed to proceed.  History of Present Illness: Calvin Long is a 48 y.o. male who would like to discuss medications - following up DM2    Last visit we initiated long-acting insulin, he had been rationing novolog, A1C was 11.1. Glc in AM around 200s and he's taking Tresiba 60 units daily. Reports doing well on this regimen, no hypoglycemia.   (+)HCV, GI follow-up is in place.   Has cataract surgery probably in the spring.     Observations/Objective: Wt 210 lb (95.3 kg)   BMI 27.71 kg/m  BP Readings from Last 3 Encounters:  09/20/19 138/90  08/14/17 114/76  10/03/14 108/71   Exam: Normal Speech.  NAD  Lab and Radiology Results No results found for this or any previous visit (from the past 72 hour(s)). No results found.     Assessment and Plan: 48 y.o. male with The encounter diagnosis was Type 2 diabetes mellitus with other specified complication, with long-term current use of insulin (HCC).    Patient Instructions  Plan: Continue to go up on the Tresiba as directed by 5 units or so twice per week, to target fasting sugar 90-130, maximum 100 units. Let's recheck A1C in 3 months or so, but let me know sooner than that if needed!     Instructions sent via MyChart     Follow Up Instructions: Return in about 2 months (around 12/26/2019) for follow up in office for diabetes (or can get labs ahead of visit and we can do virtual) . MyChart message sent as reminder. Will also get some blood work repeat around that time.     I discussed the  assessment and treatment plan with the patient. The patient was provided an opportunity to ask questions and all were answered. The patient agreed with the plan and demonstrated an understanding of the instructions.   The patient was advised to call back or seek an in-person evaluation if any new concerns, if symptoms worsen or if the condition fails to improve as anticipated.  15 minutes of non-face-to-face time was provided during this encounter.      . . . . . . . . . . . . . Marland Kitchen                   Historical information moved to improve visibility of documentation.  Past Medical History:  Diagnosis Date  . Diabetes mellitus    Past Surgical History:  Procedure Laterality Date  . NO PAST SURGERIES     Social History   Tobacco Use  . Smoking status: Current Every Day Smoker    Packs/day: 1.00  . Smokeless tobacco: Never Used  Substance Use Topics  . Alcohol use: Yes   family history includes Diabetes in his mother and another family member; Hypertension in an other family member.  Medications: Current Outpatient Medications  Medication Sig Dispense Refill  . gabapentin (NEURONTIN) 100 MG capsule Take 1-3 capsules (100-300 mg total) by mouth 3 (three) times daily. 90 capsule 3  . glucose blood (FREESTYLE TEST STRIPS)  test strip Check blood sugar once a day, first thing in the morning. 50 each 5  . ibuprofen (ADVIL,MOTRIN) 200 MG tablet Take 400 mg by mouth every 6 (six) hours as needed for moderate pain.    . Insulin Degludec (TRESIBA FLEXTOUCH) 200 UNIT/ML SOPN Inject 40-100 Units into the skin at bedtime. 4 pen 11  . ketoconazole (NIZORAL) 2 % shampoo Apply 1 application topically 2 (two) times a week. 240 mL 1  . metFORMIN (GLUCOPHAGE) 500 MG tablet Take 2 tablets (1,000 mg total) by mouth 2 (two) times daily with a meal. 120 tablet 5  . sildenafil (VIAGRA) 100 MG tablet Take 0.5-1 tablets (50-100 mg total) by mouth daily as needed for erectile  dysfunction. 10 tablet 11   No current facility-administered medications for this visit.   No Known Allergies

## 2020-03-21 ENCOUNTER — Ambulatory Visit: Payer: BC Managed Care – PPO | Admitting: Osteopathic Medicine

## 2020-05-03 ENCOUNTER — Emergency Department (INDEPENDENT_AMBULATORY_CARE_PROVIDER_SITE_OTHER)
Admission: EM | Admit: 2020-05-03 | Discharge: 2020-05-03 | Disposition: A | Discharge status: Home / Self Care | Payer: BC Managed Care – PPO | Source: Home / Self Care | Attending: Family Medicine | Admitting: Family Medicine

## 2020-05-03 ENCOUNTER — Other Ambulatory Visit: Payer: Self-pay

## 2020-05-03 DIAGNOSIS — R110 Nausea: Secondary | ICD-10-CM

## 2020-05-03 DIAGNOSIS — R197 Diarrhea, unspecified: Secondary | ICD-10-CM

## 2020-05-03 MED ORDER — ONDANSETRON 4 MG PO TBDP
4.0000 mg | ORAL_TABLET | Freq: Once | ORAL | Status: AC
  Administered 2020-05-03: 13:00:00 4 mg via ORAL

## 2020-05-03 MED ORDER — ONDANSETRON HCL 4 MG/2ML IJ SOLN: 4.0000 mg | Freq: Once | INTRAMUSCULAR | Status: DC

## 2020-05-03 MED ORDER — ONDANSETRON 4 MG PO TBDP: 4.0000 mg | ORAL_TABLET | Freq: Three times a day (TID) | ORAL | 0 refills | Status: DC | PRN

## 2020-05-03 NOTE — ED Provider Notes (Signed)
[facility]    CSN: [id number] Arrival date & time: 05/03/20  1257      History   Chief Complaint Chief Complaint  Patient presents with   Nausea   Letter for School/Work    HPI Calvin Long is a 49 y.o. male.   Patient complains awoke with nausea (no vomiting), loose stools, and sweats this morning. He denies fever.  He believes that he may have food poisoning from something he ate last night.  His son had similar symptoms. Patient has chronic hepatitis C, having been started on Epclusa by Dr. Caryl Never.   The history is provided by the patient.    Past Medical History:  Diagnosis Date   Diabetes mellitus     Patient Active Problem List   Diagnosis Date Noted   Hepatitis C virus infection without hepatic coma 10/04/2019   Paronychia of right thumb 09/03/2012   Type 2 diabetes mellitus (HCC) 07/15/2012   DKA, type 2, not at goal [facility]) 06/29/2012   Dehydration 06/29/2012   Hyponatremia 06/29/2012   Tobacco abuse 06/29/2012   Alcohol use 06/29/2012   HTN (hypertension) 06/29/2012   Skin rash 06/29/2012   Thrombocytopenia (HCC) 06/29/2012    Past Surgical History:  Procedure Laterality Date   NO PAST SURGERIES         Home Medications    Prior to Admission medications   Medication Sig Start Date End Date Taking? Authorizing Provider  metFORMIN (GLUCOPHAGE) 500 MG tablet Take 2 tablets (1,000 mg total) by mouth 2 (two) times daily with a meal. 09/20/19  Yes Sunnie Nielsen, DO  gabapentin (NEURONTIN) 100 MG capsule Take 1-3 capsules (100-300 mg total) by mouth 3 (three) times daily. 09/20/19   Sunnie Nielsen, DO  glucose blood (FREESTYLE TEST STRIPS) test strip Check blood sugar once a day, first thing in the morning. 12/24/13   Laren Boom, DO  ibuprofen (ADVIL,MOTRIN) 200 MG tablet Take 400 mg by mouth every 6 (six) hours as needed for moderate pain.    [provider]  Insulin Degludec (TRESIBA FLEXTOUCH) 200  UNIT/ML SOPN Inject 40-100 Units into the skin at bedtime. 10/25/19   Sunnie Nielsen, DO  ketoconazole (NIZORAL) 2 % shampoo Apply 1 application topically 2 (two) times a week. 09/30/19   Sunnie Nielsen, DO  ondansetron (ZOFRAN ODT) 4 MG disintegrating tablet Take 1 tablet (4 mg total) by mouth every 8 (eight) hours as needed for nausea or vomiting. Dissolve under tongue 05/03/20   Lattie Haw, MD  sildenafil (VIAGRA) 100 MG tablet Take 0.5-1 tablets (50-100 mg total) by mouth daily as needed for erectile dysfunction. 09/20/19   Sunnie Nielsen, DO    Family History Family History  Problem Relation Age of Onset   Diabetes Mother    Diabetes Other    Hypertension Other     Social History Social History   Tobacco Use   Smoking status: Current Every Day Smoker    Packs/day: 1.00    Types: Cigarettes   Smokeless tobacco: Never Used  Vaping Use   Vaping Use: Never used  Substance Use Topics   Alcohol use: Yes   Drug use: No     Allergies   Patient has no known allergies.   Review of Systems Review of Systems  Constitutional: Positive for activity change, appetite change, chills, diaphoresis and fatigue. Negative for fever.  HENT: Negative.   Eyes: Negative.   Respiratory: Negative.   Cardiovascular: Negative.   Gastrointestinal: Positive for diarrhea and nausea.  Negative for abdominal distention, abdominal pain, anal bleeding, blood in stool, constipation and vomiting.  Genitourinary: Negative.   Neurological: Negative for dizziness.  All other systems reviewed and are negative.    Physical Exam Triage Vital Signs ED Triage Vitals  Enc Vitals Group     BP 05/03/20 1311 (!) 181/104     Pulse Rate 05/03/20 1311 94     Resp 05/03/20 1311 16     Temp 05/03/20 1311 98.5 F (36.9 C)     Temp Source 05/03/20 1311 Oral     SpO2 05/03/20 1311 99 %     Weight --      Height --      Head Circumference --      Peak Flow --      Pain Score 05/03/20  1309 0     Pain Loc --      Pain Edu? --      Excl. in GC? --    No data found.  Updated Vital Signs BP (!) 181/104 (BP Location: Left Arm)    Pulse 94    Temp 98.5 F (36.9 C) (Oral)    Resp 16    SpO2 99%   Visual Acuity Right Eye Distance:   Left Eye Distance:   Bilateral Distance:    Right Eye Near:   Left Eye Near:    Bilateral Near:     Physical Exam Nursing notes and Vital Signs reviewed. Appearance:  Patient appears stated age, and in no acute distress.    Eyes:  Pupils are equal, round, and reactive to light and accomodation.  Extraocular movement is intact.  Conjunctivae are not inflamed   Pharynx:  Normal; moist mucous membranes  Neck:  Supple.  No adenopathy Lungs:  Clear to auscultation.  Breath sounds are equal.  Moving air well. Heart:  Regular rate and rhythm without murmurs, rubs, or gallops.  Abdomen:  Nontender without masses or hepatosplenomegaly.  Bowel sounds are present.  No CVA or flank tenderness.  Extremities:  No edema.  Skin:  No rash present.     UC Treatments / Results  Labs (all labs ordered are listed, but only abnormal results are displayed) Labs Reviewed - No data to display  EKG   Radiology No results found.  Procedures Procedures (including critical care time)  Medications Ordered in UC Medications  ondansetron (ZOFRAN-ODT) disintegrating tablet 4 mg (4 mg Oral Given 05/03/20 1316)    Initial Impression / Assessment and Plan / UC Course  I have reviewed the triage vital signs and the nursing notes.  Pertinent labs & imaging results that were available during my care of the patient were reviewed by me and considered in my medical decision making (see chart for details).    Administered Zofran ODT 4mg  PO; given Rx for same. Benign exam; Suspect viral gastroenteritis. Follow-up PCP if not improved about 5 days.   Final Clinical Impressions(s) / UC Diagnoses   Final diagnoses:  Nausea  Diarrhea, unspecified type      Discharge Instructions     Begin oral rehydration solution for about 12 to 18 hours until diarrhea stops, then switch to clear liquids (apple juice, clear grape juice, Jello, etc) for about 12 to 18 hours.  When improved, advance to a (Bananas, Rice, Applesauce, Toast). Then gradually resume a regular diet when tolerated.  Avoid milk products until well.     If symptoms become significantly worse during the night or over the weekend, proceed  to the local emergency room.    ED Prescriptions    Medication Sig Dispense Auth. Provider   ondansetron (ZOFRAN ODT) 4 MG disintegrating tablet Take 1 tablet (4 mg total) by mouth every 8 (eight) hours as needed for nausea or vomiting. Dissolve under tongue 8 tablet Lattie Haw, MD        Lattie Haw, MD 05/06/20 1600

## 2020-05-03 NOTE — ED Triage Notes (Signed)
Patient presents to Urgent Care with complaints of nausea and occasionally breaking out in a sweat since this morning. Patient reports he thinks he ate something bad yesterday, requesting a work note.

## 2020-05-03 NOTE — Discharge Instructions (Addendum)
Begin oral rehydration solution for about 12 to 18 hours until diarrhea stops, then switch to clear liquids (apple juice, clear grape juice, Jello, etc) for about 12 to 18 hours.  When improved, advance to a [employer] (Bananas, Rice, Applesauce, Toast). Then gradually resume a regular diet when tolerated.  Avoid milk products until well.     If symptoms become significantly worse during the night or over the weekend, proceed to the local emergency room.

## 2020-05-25 ENCOUNTER — Ambulatory Visit: Payer: BC Managed Care – PPO | Admitting: Osteopathic Medicine

## 2020-06-02 ENCOUNTER — Ambulatory Visit (INDEPENDENT_AMBULATORY_CARE_PROVIDER_SITE_OTHER): Payer: BC Managed Care – PPO | Admitting: Medical-Surgical

## 2020-06-02 ENCOUNTER — Encounter: Payer: Self-pay | Admitting: Medical-Surgical

## 2020-06-02 ENCOUNTER — Other Ambulatory Visit: Payer: Self-pay

## 2020-06-02 VITALS — BP 145/86 | HR 83 | Temp 98.0°F | Ht 73.0 in | Wt 217.9 lb

## 2020-06-02 DIAGNOSIS — E1140 Type 2 diabetes mellitus with diabetic neuropathy, unspecified: Secondary | ICD-10-CM | POA: Diagnosis not present

## 2020-06-02 DIAGNOSIS — M545 Low back pain, unspecified: Secondary | ICD-10-CM

## 2020-06-02 DIAGNOSIS — Z794 Long term (current) use of insulin: Secondary | ICD-10-CM

## 2020-06-02 DIAGNOSIS — E1169 Type 2 diabetes mellitus with other specified complication: Secondary | ICD-10-CM | POA: Diagnosis not present

## 2020-06-02 LAB — POCT GLYCOSYLATED HEMOGLOBIN (HGB A1C): Hemoglobin A1C: 9.2 % — AB (ref 4.0–5.6)

## 2020-06-02 LAB — POCT UA - MICROALBUMIN
Creatinine, POC: 100 mg/dL
Microalbumin Ur, POC: 80 mg/L

## 2020-06-02 MED ORDER — IPRATROPIUM BROMIDE 0.03 % NA SOLN: 2.0000 | Freq: Two times a day (BID) | NASAL | 0 refills | Status: DC

## 2020-06-02 MED ORDER — METFORMIN HCL 500 MG PO TABS
1000.0000 mg | ORAL_TABLET | Freq: Two times a day (BID) | ORAL | 5 refills | Status: DC
Start: 2020-06-02 — End: 2020-06-14

## 2020-06-02 NOTE — Progress Notes (Signed)
Subjective:    CC: Diabetes follow-up, back pain  HPI: Pleasant 49 year old male presenting today for diabetes follow-up and acute back pain.  Diabetes-currently taking Tresiba 100 units into the skin at bedtime.  Prescribed Metformin 1000 mg twice daily but admits to taking these only 2 to 3 days a week as prescribed.  Reports he cannot remember to take his medications at times.  Fasting a.m. blood sugars 187 to low 200s but admits to not checking his sugars every day.  Usually tries to eat trail mix for breakfast and then some sort of wrap for lunch.  For dinner he eats meat with vegetables and a starch.  Does eat white pasta, breads because he does not like wheat preparations.  Has the occasional candy bar and tends to indulge with jelly Rangers.  Has had increased stress recently in the family.  Recent vision changes, reports he is due for cataract surgery.  Also having numbness and tingling that has progressed from the feet up into the lower leg.   Back pain-works at [employer].  Yesterday was moving some heavy cases and twisted to set them down.  This morning he awoke with left lower back pain.  He did not go to work today and has been resting.  Notes the back pain has started to feel better so he feels this is just a muscle strain.  He is requesting a work note to excuse him for his absence today.  I reviewed the past medical history, family history, social history, surgical history, and allergies today and no changes were needed.  Please see the problem list section below in epic for further details.  Past Medical History: Past Medical History:  Diagnosis Date  . Diabetes mellitus    Past Surgical History: Past Surgical History:  Procedure Laterality Date  . NO PAST SURGERIES     Social History: Social History   Socioeconomic History  . Marital status: Single    Spouse name: Not on file  . Number of children: Not on file  . Years of education: Not on file  . Highest education  level: Not on file  Occupational History  . Not on file  Tobacco Use  . Smoking status: Current Every Day Smoker    Packs/day: 1.00    Types: Cigarettes  . Smokeless tobacco: Never Used  Vaping Use  . Vaping Use: Never used  Substance and Sexual Activity  . Alcohol use: Yes  . Drug use: No  . Sexual activity: Yes    Partners: Female  Other Topics Concern  . Not on file  Social History Narrative  . Not on file   Social Determinants of Health   Financial Resource Strain:   . Difficulty of Paying Living Expenses:   Food Insecurity:   . Worried About Programme researcher, broadcasting/film/video in the Last Year:   . Barista in the Last Year:   Transportation Needs:   . Freight forwarder (Medical):   Marland Kitchen Lack of Transportation (Non-Medical):   Physical Activity:   . Days of Exercise per Week:   . Minutes of Exercise per Session:   Stress:   . Feeling of Stress :   Social Connections:   . Frequency of Communication with Friends and Family:   . Frequency of Social Gatherings with Friends and Family:   . Attends Religious Services:   . Active Member of Clubs or Organizations:   . Attends Banker Meetings:   Marland Kitchen Marital Status:  Family History: Family History  Problem Relation Age of Onset  . Diabetes Mother   . Diabetes Other   . Hypertension Other    Allergies: No Known Allergies Medications: See med rec.  Review of Systems: See HPI for pertinent positives and negatives.   Objective:    General: Well Developed, well nourished, and in no acute distress.  Neuro: Alert and oriented x3.  HEENT: Normocephalic, atraumatic.  Skin: Warm and dry. Cardiac: Regular rate and rhythm, no murmurs rubs or gallops, no lower extremity edema.  Respiratory: Clear to auscultation bilaterally. Not using accessory muscles, speaking in full sentences.  Impression and Recommendations:    1. Type 2 diabetes mellitus with other specified complication, with long-term current use of  insulin (HCC) Continue Tresiba 100 units nightly.  Strongly encouraged adherence to Metformin 1000 mg twice daily with meals.  POCT hemoglobin A1c 9.2%.  POCT microalbumin completed, abnormal.  Diabetic foot exam completed with numbness to bilateral heels.  Strongly encouraged dietary modification.  Patient asking for resources for diabetic education.  Discussed online resources as well as nutrition referral.  Patient agreeable to referral for diabetic education.  Checking CBC and CMP today. - POCT UA - Microalbumin - POCT glycosylated hemoglobin (Hb A1C) - Ambulatory referral to diabetic education - metFORMIN (GLUCOPHAGE) 500 MG tablet; Take 2 tablets (1,000 mg total) by mouth 2 (two) times daily with a meal.  Dispense: 120 tablet; Refill: 5 - CBC - COMPLETE METABOLIC PANEL WITH GFR  2. Type 2 diabetes mellitus with diabetic neuropathy, with long-term current use of insulin (HCC) Discussed long-term complications related to uncontrolled diabetes.  Advised that peripheral neuropathy will continue to be an issue but controlling blood sugars may help to prevent worsening.  Restart gabapentin 100-300 mg daily as needed.  3. Acute left-sided low back pain without sciatica Continue conservative measures.  Work note provided as requested.  Return in about 3 months (around 09/02/2020) for Diabetes follow up. ___________________________________________ Thayer Ohm, DNP, APRN, FNP-BC Primary Care and Sports Medicine [facility]

## 2020-06-03 LAB — CBC
HCT: 42.7 % (ref 38.5–50.0)
Hemoglobin: 15.1 g/dL (ref 13.2–17.1)
MCH: 31.8 pg (ref 27.0–33.0)
MCHC: 35.4 g/dL (ref 32.0–36.0)
MCV: 89.9 fL (ref 80.0–100.0)
MPV: 9.9 fL (ref 7.5–12.5)
Platelets: 194 10*3/uL (ref 140–400)
RBC: 4.75 10*6/uL (ref 4.20–5.80)
RDW: 13.2 % (ref 11.0–15.0)
WBC: 5.5 10*3/uL (ref 3.8–10.8)

## 2020-06-03 LAB — COMPLETE METABOLIC PANEL WITH GFR
AG Ratio: 1.4 (calc) (ref 1.0–2.5)
ALT: 29 U/L (ref 9–46)
AST: 13 U/L (ref 10–40)
Albumin: 4 g/dL (ref 3.6–5.1)
Alkaline phosphatase (APISO): 50 U/L (ref 36–130)
BUN: 24 mg/dL (ref 7–25)
CO2: 24 mmol/L (ref 20–32)
Calcium: 8.5 mg/dL — ABNORMAL LOW (ref 8.6–10.3)
Chloride: 103 mmol/L (ref 98–110)
Creat: 1.03 mg/dL (ref 0.60–1.35)
GFR, Est African American: 98 mL/min/{1.73_m2} (ref >=60)
GFR, Est Non African American: 85 mL/min/{1.73_m2} (ref >=60)
Globulin: 2.8 g/dL (calc) (ref 1.9–3.7)
Glucose, Bld: 320 mg/dL — ABNORMAL HIGH (ref 65–139)
Potassium: 4.5 mmol/L (ref 3.5–5.3)
Sodium: 136 mmol/L (ref 135–146)
Total Bilirubin: 0.6 mg/dL (ref 0.2–1.2)
Total Protein: 6.8 g/dL (ref 6.1–8.1)

## 2020-06-13 ENCOUNTER — Encounter: Payer: Self-pay | Admitting: Medical-Surgical

## 2020-06-13 ENCOUNTER — Other Ambulatory Visit: Payer: Self-pay

## 2020-06-13 MED ORDER — IPRATROPIUM BROMIDE 0.03 % NA SOLN: 2.0000 | Freq: Two times a day (BID) | NASAL | 0 refills | Status: DC

## 2020-06-14 ENCOUNTER — Other Ambulatory Visit: Payer: Self-pay

## 2020-06-14 DIAGNOSIS — E1169 Type 2 diabetes mellitus with other specified complication: Secondary | ICD-10-CM

## 2020-06-14 MED ORDER — METFORMIN HCL 500 MG PO TABS: 1000.0000 mg | ORAL_TABLET | Freq: Two times a day (BID) | ORAL | 5 refills | Status: DC

## 2020-06-14 MED ORDER — KETOCONAZOLE 2 % EX SHAM: 1.0000 "application " | MEDICATED_SHAMPOO | CUTANEOUS | 1 refills | Status: DC

## 2020-06-14 MED ORDER — TRESIBA FLEXTOUCH 200 UNIT/ML ~~LOC~~ SOPN: 40.0000 [IU] | PEN_INJECTOR | Freq: Every day | SUBCUTANEOUS | 5 refills | Status: DC

## 2020-06-14 MED ORDER — GABAPENTIN 100 MG PO CAPS: 100.0000 mg | ORAL_CAPSULE | Freq: Two times a day (BID) | ORAL | 0 refills | Status: DC | PRN

## 2020-07-12 ENCOUNTER — Other Ambulatory Visit: Payer: Self-pay | Admitting: Medical-Surgical

## 2020-08-09 ENCOUNTER — Other Ambulatory Visit: Payer: Self-pay | Admitting: Osteopathic Medicine

## 2020-08-14 MED ORDER — GABAPENTIN 100 MG PO CAPS: 100.0000 mg | ORAL_CAPSULE | Freq: Two times a day (BID) | ORAL | 0 refills | Status: DC | PRN

## 2020-08-16 ENCOUNTER — Encounter: Payer: Self-pay | Admitting: Osteopathic Medicine

## 2020-08-16 ENCOUNTER — Telehealth (INDEPENDENT_AMBULATORY_CARE_PROVIDER_SITE_OTHER): Payer: BC Managed Care – PPO | Admitting: Osteopathic Medicine

## 2020-08-16 VITALS — Temp 98.8°F | Wt 230.0 lb

## 2020-08-16 DIAGNOSIS — Z794 Long term (current) use of insulin: Secondary | ICD-10-CM

## 2020-08-16 DIAGNOSIS — I10 Essential (primary) hypertension: Secondary | ICD-10-CM

## 2020-08-16 DIAGNOSIS — U071 COVID-19: Secondary | ICD-10-CM

## 2020-08-16 DIAGNOSIS — E1169 Type 2 diabetes mellitus with other specified complication: Secondary | ICD-10-CM | POA: Diagnosis not present

## 2020-08-16 MED ORDER — GUAIFENESIN-CODEINE 100-10 MG/5ML PO SYRP: 5.0000 mL | ORAL_SOLUTION | Freq: Four times a day (QID) | ORAL | 0 refills | Status: DC | PRN

## 2020-08-16 NOTE — Progress Notes (Signed)
Virtual Visit via Video (App used: MyChart) Note  I connected with      Calvin Long on 08/16/20 at 9:19 AM  by a telemedicine application and verified that I am speaking with the correct person using two identifiers.  Patient is AT HOME I am in office   I discussed the limitations of evaluation and management by telemedicine and the availability of in person appointments. The patient expressed understanding and agreed to proceed.  History of Present Illness: Calvin Long is a 49 y.o. male who would like to discuss COVID  Sinus congestion, headaches, mild cough (cough is better compared to last week), getting a little winded with walking, significant fatigue. Tested (-)08/08/20, reststed (+)08/17/20. Son also (+). Waiting on test for wife.     Observations/Objective: Temp 98.8 F (37.1 C) (Oral)    Wt 230 lb (104.3 kg)    BMI 30.34 kg/m  BP Readings from Last 3 Encounters:  06/02/20 (!) 145/86  05/03/20 (!) 181/104  09/20/19 138/90   Exam: Normal Speech.  NAD  Lab and Radiology Results No results found for this or any previous visit (from the past 72 hour(s)). No results found.     Assessment and Plan: 49 y.o. male with The primary encounter diagnosis was COVID-19. Diagnoses of Type 2 diabetes mellitus with other specified complication, with long-term current use of insulin (HCC) and Essential hypertension were also pertinent to this visit.  Will ese if we can get him set up for monoclonal Ab treatments given comorbids, last A1C showed poor DM control.    PDMP not reviewed this encounter. No orders of the defined types were placed in this encounter.  Meds ordered this encounter  Medications   guaiFENesin-codeine (ROBITUSSIN AC) 100-10 MG/5ML syrup    Sig: Take 5-10 mLs by mouth 4 (four) times daily as needed for cough.    Dispense:  180 mL    Refill:  0     Follow Up Instructions: Return if symptoms worsen or fail to improve.    I discussed  the assessment and treatment plan with the patient. The patient was provided an opportunity to ask questions and all were answered. The patient agreed with the plan and demonstrated an understanding of the instructions.   The patient was advised to call back or seek an in-person evaluation if any new concerns, if symptoms worsen or if the condition fails to improve as anticipated.  30 minutes of non-face-to-face time was provided during this encounter.      . . . . . . . . . . . . . Marland Kitchen                   Historical information moved to improve visibility of documentation.  Past Medical History:  Diagnosis Date   Diabetes mellitus    Past Surgical History:  Procedure Laterality Date   NO PAST SURGERIES     Social History   Tobacco Use   Smoking status: Current Every Day Smoker    Packs/day: 1.00    Types: Cigarettes   Smokeless tobacco: Never Used  Substance Use Topics   Alcohol use: Yes   family history includes Diabetes in his mother and another family member; Hypertension in an other family member.  Medications: Current Outpatient Medications  Medication Sig Dispense Refill   EPCLUSA 400-100 MG TABS Take 1 tablet by mouth daily.     gabapentin (NEURONTIN) 100 MG capsule Take 1-3 capsules (100-300 mg total) by mouth  2 (two) times daily as needed. 180 capsule 0   glucose blood (FREESTYLE TEST STRIPS) test strip Check blood sugar once a day, first thing in the morning. 50 each 5   ibuprofen (ADVIL,MOTRIN) 200 MG tablet Take 400 mg by mouth every 6 (six) hours as needed for moderate pain.     insulin degludec (TRESIBA FLEXTOUCH) 200 UNIT/ML FlexTouch Pen Inject 40-100 Units into the skin at bedtime. 3 mL 5   ipratropium (ATROVENT) 0.03 % nasal spray Instill 2 sprays in each nostril every 12 hours. 30 mL 1   ketoconazole (NIZORAL) 2 % shampoo Apply 1 application topically 2 (two) times a week. 240 mL 1   metFORMIN (GLUCOPHAGE) 500 MG  tablet Take 2 tablets (1,000 mg total) by mouth 2 (two) times daily with a meal. 120 tablet 5   ondansetron (ZOFRAN ODT) 4 MG disintegrating tablet Take 1 tablet (4 mg total) by mouth every 8 (eight) hours as needed for nausea or vomiting. Dissolve under tongue 8 tablet 0   sildenafil (VIAGRA) 100 MG tablet Take 0.5-1 tablets (50-100 mg total) by mouth daily as needed for erectile dysfunction. 10 tablet 11   guaiFENesin-codeine (ROBITUSSIN AC) 100-10 MG/5ML syrup Take 5-10 mLs by mouth 4 (four) times daily as needed for cough. 180 mL 0   No current facility-administered medications for this visit.   No Known Allergies Immunization History  Administered Date(s) Administered   Hepatitis A, Ped/Adol-2 Dose 05/02/2020   Hepatitis B, adult 05/02/2020, 05/29/2020   PFIZER SARS-COV-2 Vaccination 03/11/2020, 04/11/2020   Pneumococcal Polysaccharide-23 07/01/2012   Pneumococcal-Unspecified 07/01/2012   Tdap 11/28/2016

## 2020-08-16 NOTE — Addendum Note (Signed)
Addended by: Deirdre Pippins on: 08/16/2020 09:42 AM   Modules accepted: Orders

## 2020-08-17 ENCOUNTER — Other Ambulatory Visit: Payer: Self-pay | Admitting: Family

## 2020-08-17 DIAGNOSIS — U071 COVID-19: Secondary | ICD-10-CM

## 2020-08-17 NOTE — Progress Notes (Signed)
I connected by phone with Calvin Long on 08/17/2020 at 1:18 PM to discuss the potential use of a new treatment for mild to moderate COVID-19 viral infection in non-hospitalized patients.  This patient is a 49 y.o. male that meets the FDA criteria for Emergency Use Authorization of COVID monoclonal antibody casirivimab/imdevimab or bamlanivimab/eteseviamb.  Has a (+) direct SARS-CoV-2 viral test result  Has mild or moderate COVID-19   Is NOT hospitalized due to COVID-19  Is within 10 days of symptom onset  Has at least one of the high risk factor(s) for progression to severe COVID-19 and/or hospitalization as defined in EUA.  Specific high risk criteria : BMI > 25, Cardiovascular disease or hypertension and Other high risk medical condition per CDC:  Smoker Symptoms of congestion, H/A, cough, and SOB began 08/12/20.  I have spoken and communicated the following to the patient or parent/caregiver regarding COVID monoclonal antibody treatment:  1. FDA has authorized the emergency use for the treatment of mild to moderate COVID-19 in adults and pediatric patients with positive results of direct SARS-CoV-2 viral testing who are 84 years of age and older weighing at least 40 kg, and who are at high risk for progressing to severe COVID-19 and/or hospitalization.  2. The significant known and potential risks and benefits of COVID monoclonal antibody, and the extent to which such potential risks and benefits are unknown.  3. Information on available alternative treatments and the risks and benefits of those alternatives, including clinical trials.  4. Patients treated with COVID monoclonal antibody should continue to self-isolate and use infection control measures (e.g., wear mask, isolate, social distance, avoid sharing personal items, clean and disinfect high touch surfaces, and frequent handwashing) according to CDC guidelines.   5. The patient or parent/caregiver has the option to  accept or refuse COVID monoclonal antibody treatment.  After reviewing this information with the patient, the patient has agreed to receive one of the available covid 19 monoclonal antibodies and will be provided an appropriate fact sheet prior to infusion. Morton Stall, NP 08/17/2020 1:18 PM

## 2020-08-18 ENCOUNTER — Ambulatory Visit (HOSPITAL_COMMUNITY)
Admission: RE | Admit: 2020-08-18 | Discharge: 2020-08-18 | Disposition: A | Discharge status: Home / Self Care | Payer: BC Managed Care – PPO | Source: Ambulatory Visit | Attending: Pulmonary Disease | Admitting: Pulmonary Disease

## 2020-08-18 DIAGNOSIS — F17200 Nicotine dependence, unspecified, uncomplicated: Secondary | ICD-10-CM | POA: Insufficient documentation

## 2020-08-18 DIAGNOSIS — I10 Essential (primary) hypertension: Secondary | ICD-10-CM | POA: Insufficient documentation

## 2020-08-18 DIAGNOSIS — U071 COVID-19: Secondary | ICD-10-CM | POA: Diagnosis present

## 2020-08-18 MED ORDER — SODIUM CHLORIDE 0.9 % IV SOLN: Freq: Once | INTRAVENOUS | Status: AC

## 2020-08-18 MED ORDER — DIPHENHYDRAMINE HCL 50 MG/ML IJ SOLN: 50.0000 mg | Freq: Once | INTRAMUSCULAR | Status: DC | PRN

## 2020-08-18 MED ORDER — ALBUTEROL SULFATE HFA 108 (90 BASE) MCG/ACT IN AERS: 2.0000 | INHALATION_SPRAY | Freq: Once | RESPIRATORY_TRACT | Status: DC | PRN

## 2020-08-18 MED ORDER — METHYLPREDNISOLONE SODIUM SUCC 125 MG IJ SOLR: 125.0000 mg | Freq: Once | INTRAMUSCULAR | Status: DC | PRN

## 2020-08-18 MED ORDER — EPINEPHRINE 0.3 MG/0.3ML IJ SOAJ: 0.3000 mg | Freq: Once | INTRAMUSCULAR | Status: DC | PRN

## 2020-08-18 MED ORDER — FAMOTIDINE IN NACL 20-0.9 MG/50ML-% IV SOLN: 20.0000 mg | Freq: Once | INTRAVENOUS | Status: DC | PRN

## 2020-08-18 MED ORDER — SODIUM CHLORIDE 0.9 % IV SOLN: INTRAVENOUS | Status: DC | PRN

## 2020-08-18 NOTE — Progress Notes (Signed)
  Diagnosis: COVID-19  Physician:patrick wright  Procedure: [facility] Med: bamlanivimab\etesevimab infusion - Provided patient with bamlanimivab\etesevimab fact sheet for patients, parents and caregivers prior to infusion.  Complications: No immediate complications noted.  Discharge: Discharged home   Shaune Spittle 08/18/2020

## 2020-08-18 NOTE — Discharge Instructions (Signed)

## 2020-08-22 ENCOUNTER — Encounter: Payer: Self-pay | Admitting: Osteopathic Medicine

## 2020-08-22 ENCOUNTER — Other Ambulatory Visit: Payer: Self-pay | Admitting: Osteopathic Medicine

## 2020-08-22 ENCOUNTER — Telehealth: Payer: Self-pay

## 2020-08-22 DIAGNOSIS — U071 COVID-19: Secondary | ICD-10-CM

## 2020-08-22 NOTE — Telephone Encounter (Signed)
Pt called stating he was due to go back to work on 08/23/20. Per pt, he is still not feeling well. He did complete infusion on 08/16/20. Pt is requesting a med refill for guaFENesin-codeine. His employer is requesting a form to be completed before patient can return back to work. He will try to drop the forms off today. Pt is aware provider will be out of the office Friday and Monday.

## 2020-08-24 ENCOUNTER — Telehealth: Payer: Self-pay

## 2020-08-24 ENCOUNTER — Encounter: Payer: Self-pay | Admitting: Osteopathic Medicine

## 2020-08-24 DIAGNOSIS — U071 COVID-19: Secondary | ICD-10-CM | POA: Insufficient documentation

## 2020-08-24 MED ORDER — PEN NEEDLES 32G X 5 MM MISC: 99 refills | Status: AC

## 2020-08-24 NOTE — Telephone Encounter (Signed)
[employer] called requesting a rx for pen needles 32 g 5/8. New rx.

## 2020-08-25 ENCOUNTER — Other Ambulatory Visit: Payer: Self-pay

## 2020-08-31 ENCOUNTER — Telehealth: Payer: Self-pay

## 2020-08-31 ENCOUNTER — Encounter (INDEPENDENT_AMBULATORY_CARE_PROVIDER_SITE_OTHER): Payer: BC Managed Care – PPO | Admitting: Osteopathic Medicine

## 2020-08-31 DIAGNOSIS — Z760 Encounter for issue of repeat prescription: Secondary | ICD-10-CM

## 2020-08-31 NOTE — Telephone Encounter (Signed)
Pt called requesting a cough medicine. Per pt, continues to have coughing, especially during exertion. Pt stated that he returned to work yesterday, but missed today. He has an appointment on 09/04/2020. Please review telephone encounter on 08/24/20.

## 2020-09-01 MED ORDER — ALBUTEROL SULFATE HFA 108 (90 BASE) MCG/ACT IN AERS: 2.0000 | INHALATION_SPRAY | Freq: Four times a day (QID) | RESPIRATORY_TRACT | 1 refills | Status: DC | PRN

## 2020-09-01 MED ORDER — PREDNISONE 20 MG PO TABS: 20.0000 mg | ORAL_TABLET | Freq: Two times a day (BID) | ORAL | 0 refills | Status: DC

## 2020-09-01 MED ORDER — BENZONATATE 200 MG PO CAPS: 200.0000 mg | ORAL_CAPSULE | Freq: Three times a day (TID) | ORAL | 0 refills | Status: DC | PRN

## 2020-09-01 NOTE — Telephone Encounter (Signed)
See MyChart message

## 2020-09-01 NOTE — Telephone Encounter (Signed)
11 min spent on record review, new prescriptions, follow-up greater than 10 days from his original appointment for this issue.

## 2020-09-04 ENCOUNTER — Encounter: Payer: Self-pay | Admitting: Osteopathic Medicine

## 2020-09-04 ENCOUNTER — Ambulatory Visit (INDEPENDENT_AMBULATORY_CARE_PROVIDER_SITE_OTHER): Payer: BC Managed Care – PPO | Admitting: Osteopathic Medicine

## 2020-09-04 VITALS — BP 181/116 | HR 105 | Temp 98.3°F | Wt 216.1 lb

## 2020-09-04 DIAGNOSIS — E1140 Type 2 diabetes mellitus with diabetic neuropathy, unspecified: Secondary | ICD-10-CM

## 2020-09-04 DIAGNOSIS — E1159 Type 2 diabetes mellitus with other circulatory complications: Secondary | ICD-10-CM | POA: Diagnosis not present

## 2020-09-04 DIAGNOSIS — I152 Hypertension secondary to endocrine disorders: Secondary | ICD-10-CM

## 2020-09-04 DIAGNOSIS — E1169 Type 2 diabetes mellitus with other specified complication: Secondary | ICD-10-CM | POA: Diagnosis not present

## 2020-09-04 DIAGNOSIS — Z794 Long term (current) use of insulin: Secondary | ICD-10-CM | POA: Diagnosis not present

## 2020-09-04 DIAGNOSIS — Z8616 Personal history of COVID-19: Secondary | ICD-10-CM | POA: Diagnosis not present

## 2020-09-04 LAB — POCT GLYCOSYLATED HEMOGLOBIN (HGB A1C): Hemoglobin A1C: 7.6 % — AB (ref 4.0–5.6)

## 2020-09-04 MED ORDER — GABAPENTIN 100 MG PO CAPS
300.0000 mg | ORAL_CAPSULE | Freq: Three times a day (TID) | ORAL | 0 refills | Status: DC | PRN
Start: 2020-09-04 — End: 2020-10-11

## 2020-09-04 MED ORDER — METFORMIN HCL ER 500 MG PO TB24
500.0000 mg | ORAL_TABLET | Freq: Every day | ORAL | 3 refills | Status: DC
Start: 2020-09-04 — End: 2021-08-01

## 2020-09-04 MED ORDER — LOSARTAN POTASSIUM 50 MG PO TABS: 50.0000 mg | ORAL_TABLET | Freq: Every day | ORAL | 3 refills | Status: DC

## 2020-09-04 NOTE — Telephone Encounter (Signed)
Request was completed. Pt was seen today in office for an evaluation.

## 2020-09-04 NOTE — Progress Notes (Signed)
Calvin Long is a 49 y.o. male who presents to  [facility] at [facility]  today, 09/04/20, seeking care for the following:  . DM2 follow-up o 3 mos ago A1C 9.2 --> today 7.6 yay!  o Current antihyperglycemic Rx: Tresiba 100 units daily, metformin 1000 mg bid. C/o metformincausing continue GI effects  . HTN goal, <130/80  o Poor control and creeping up, see below  o Current antihypertensive Rx: none BP Readings from Last 3 Encounters:  09/04/20 (!) 181/116  08/18/20 (!) 157/90  06/02/20 (!) 145/86       ASSESSMENT & PLAN with other pertinent findings:  The primary encounter diagnosis was Type 2 diabetes mellitus with other specified complication, with long-term current use of insulin (HCC). Diagnoses of Hypertension associated with diabetes (HCC), History of 2019 novel coronavirus disease (COVID-19), and Type 2 diabetes mellitus with diabetic neuropathy, with long-term current use of insulin (HCC) were also pertinent to this visit.    1. Type 2 diabetes mellitus with other specified complication, with long-term current use of insulin (HCC) Control is better --> change metformin to XR hopefully minimize side effects. Advised may need to start daily SA insulin at largest meal vs weekly GLP1 injections. Pt to work on weight loss and low carb diet   2. Hypertension associated with diabetes (HCC) Uncontrolled --> started ARB today, nurse visit to recheck BP in 1-2 weeks    3. History of 2019 novel coronavirus disease (COVID-19) Cough is improving   4. Neuropathy --> increase gabapentin as below  5. L knee occasional feeling of instability  Stable exam --> sport med referral, would try home exercises for quadriceps strengthening, consider formal PT, compression sleeve ok but avoid brace     Results for orders placed or performed in visit on 09/04/20 (from the past 24 hour(s))  POCT HgB A1C     Status: Abnormal   Collection Time:  09/04/20 11:14 AM  Result Value Ref Range   Hemoglobin A1C 7.6 (A) 4.0 - 5.6 %   HbA1c POC (<> result, manual entry)     HbA1c, POC (prediabetic range)     HbA1c, POC (controlled diabetic range)         Patient Instructions   Meds ordered this encounter  Medications  . metFORMIN (GLUCOPHAGE XR) 500 MG 24 hr tablet - INSTEAD OF 1000 MG IMMEDIATE RELEASE     Sig: Take 1-2 tablets (500-1,000 mg total) by mouth daily.    Dispense:  180 tablet    Refill:  3  . gabapentin (NEURONTIN) 100 MG capsule -INCREASING DOSE, MESSAGE ME W/ WHATVER DOSE IS WORKING WELL FOR YOU, OK TO TAKE 3 TIMES PER DAY     Sig: Take 3-6 capsules (300-600 mg total) by mouth 3 (three) times daily as needed.    Dispense:  180 capsule    Refill:  0  . losartan (COZAAR) 50 MG tablet - BLOOD PRESSURE - SEE [place] IN 1 WEEK TO RECHECK BP, GOAL IS 130/80 OR LESS!     Sig: Take 1 tablet (50 mg total) by mouth daily.    Dispense:  90 tablet    Refill:  3      Orders Placed This Encounter  Procedures  . POCT HgB A1C    No orders of the defined types were placed in this encounter.      Follow-up instructions: Return for NURSE VISIT BP CHECK 1 WEEK. OFFICE VISIT MONITOR A1C IN 3 MOS .  BP (!) 181/116 (BP Location: Right Arm, Patient Position: Sitting, Cuff Size: Normal)   Pulse (!) 105   Temp 98.3 F (36.8 C) (Oral)   Wt 216 lb 1.9 oz (98 kg)   BMI 28.51 kg/m   Current Meds  Medication Sig  . albuterol (VENTOLIN HFA) 108 (90 Base) MCG/ACT inhaler Inhale 2 puffs into the lungs every 6 (six) hours as needed for wheezing or shortness of breath (cough). Generic is fine  . benzonatate (TESSALON) 200 MG capsule Take 1 capsule (200 mg total) by mouth 3 (three) times daily as needed for cough.  . EPCLUSA 400-100 MG TABS Take 1 tablet by mouth daily.  Marland Kitchen gabapentin (NEURONTIN) 100 MG capsule Take 1-3 capsules (100-300 mg total) by mouth 2  (two) times daily as needed.  Marland Kitchen glucose blood (FREESTYLE TEST STRIPS) test strip Check blood sugar once a day, first thing in the morning.  Marland Kitchen guaiFENesin-codeine 100-10 MG/5ML syrup TAKE 5 TO 10 ML BY MOUTH  4 TIMES DAILY AS NEEDED FOR COUGH  . ibuprofen (ADVIL,MOTRIN) 200 MG tablet Take 400 mg by mouth every 6 (six) hours as needed for moderate pain.  Marland Kitchen insulin degludec (TRESIBA FLEXTOUCH) 200 UNIT/ML FlexTouch Pen Inject 40-100 Units into the skin at bedtime.  . Insulin Pen Needle (PEN NEEDLES) 32G X 5 MM MISC Use as directed  . ipratropium (ATROVENT) 0.03 % nasal spray Instill 2 sprays in each nostril every 12 hours.  Marland Kitchen ketoconazole (NIZORAL) 2 % shampoo Apply 1 application topically 2 (two) times a week.  . metFORMIN (GLUCOPHAGE) 500 MG tablet Take 2 tablets (1,000 mg total) by mouth 2 (two) times daily with a meal.  . ondansetron (ZOFRAN ODT) 4 MG disintegrating tablet Take 1 tablet (4 mg total) by mouth every 8 (eight) hours as needed for nausea or vomiting. Dissolve under tongue  . predniSONE (DELTASONE) 20 MG tablet Take 1 tablet (20 mg total) by mouth 2 (two) times daily with a meal.  . sildenafil (VIAGRA) 100 MG tablet Take 0.5-1 tablets (50-100 mg total) by mouth daily as needed for erectile dysfunction.    No results found for this or any previous visit (from the past 72 hour(s)).  No results found.     All questions at time of visit were answered - patient instructed to contact office with any additional concerns or updates.  ER/RTC precautions were reviewed with the patient as applicable.   Please note: voice recognition software was used to produce this document, and typos may escape review. Please contact Dr. Lyn Hollingshead for any needed clarifications.

## 2020-09-04 NOTE — Patient Instructions (Signed)
Meds ordered this encounter  Medications  . metFORMIN (GLUCOPHAGE XR) 500 MG 24 hr tablet - INSTEAD OF 1000 MG IMMEDIATE RELEASE     Sig: Take 1-2 tablets (500-1,000 mg total) by mouth daily.    Dispense:  180 tablet    Refill:  3  . gabapentin (NEURONTIN) 100 MG capsule -INCREASING DOSE, MESSAGE ME W/ WHATVER DOSE IS WORKING WELL FOR YOU, OK TO TAKE 3 TIMES PER DAY     Sig: Take 3-6 capsules (300-600 mg total) by mouth 3 (three) times daily as needed.    Dispense:  180 capsule    Refill:  0  . losartan (COZAAR) 50 MG tablet - BLOOD PRESSURE - SEE [place] IN 1 WEEK TO RECHECK BP, GOAL IS 130/80 OR LESS!     Sig: Take 1 tablet (50 mg total) by mouth daily.    Dispense:  90 tablet    Refill:  3

## 2020-09-05 ENCOUNTER — Other Ambulatory Visit: Payer: Self-pay | Admitting: Osteopathic Medicine

## 2020-09-05 ENCOUNTER — Encounter: Payer: Self-pay | Admitting: Osteopathic Medicine

## 2020-09-11 ENCOUNTER — Ambulatory Visit: Payer: BC Managed Care – PPO

## 2020-10-11 ENCOUNTER — Other Ambulatory Visit: Payer: Self-pay | Admitting: *Deleted

## 2020-10-11 MED ORDER — GABAPENTIN 100 MG PO CAPS
300.0000 mg | ORAL_CAPSULE | Freq: Three times a day (TID) | ORAL | 0 refills | Status: DC | PRN
Start: 2020-10-11 — End: 2020-11-02

## 2020-10-13 ENCOUNTER — Ambulatory Visit (INDEPENDENT_AMBULATORY_CARE_PROVIDER_SITE_OTHER): Payer: BC Managed Care – PPO | Admitting: Osteopathic Medicine

## 2020-10-13 ENCOUNTER — Other Ambulatory Visit: Payer: Self-pay

## 2020-10-13 ENCOUNTER — Encounter: Payer: Self-pay | Admitting: Osteopathic Medicine

## 2020-10-13 ENCOUNTER — Ambulatory Visit (INDEPENDENT_AMBULATORY_CARE_PROVIDER_SITE_OTHER): Payer: BC Managed Care – PPO

## 2020-10-13 VITALS — BP 141/90 | HR 93 | Temp 98.2°F | Wt 237.0 lb

## 2020-10-13 DIAGNOSIS — M79672 Pain in left foot: Secondary | ICD-10-CM | POA: Diagnosis not present

## 2020-10-13 DIAGNOSIS — S9032XA Contusion of left foot, initial encounter: Secondary | ICD-10-CM | POA: Diagnosis not present

## 2020-10-13 DIAGNOSIS — R059 Cough, unspecified: Secondary | ICD-10-CM

## 2020-10-13 DIAGNOSIS — E1140 Type 2 diabetes mellitus with diabetic neuropathy, unspecified: Secondary | ICD-10-CM

## 2020-10-13 MED ORDER — MELOXICAM 15 MG PO TABS: 15.0000 mg | ORAL_TABLET | Freq: Every day | ORAL | 2 refills | Status: DC | PRN

## 2020-10-13 NOTE — Patient Instructions (Signed)
Plan:  See printed instructions for home exercises / stretches. I'm not convinced this is just due to plantar fasciitis but these exercises may help. If podiatry as different instructions, follow that advice!   Referral to podiatry  Boot now to immobilize for at least 3-5 days / as directed by podiatry   Work note for accomodations for now   Ask HR about FMLA for intermittent leave   Anti-inflammatory (meloxicam) take daily for 5-7 days then can keep on had for use as-needed after that  Xray today

## 2020-10-13 NOTE — Progress Notes (Signed)
Calvin Long is a 49 y.o. male who presents to  [facility] at [facility]  today, 10/13/20, seeking care for the following:   Foot pain on sole of L foot started yesterday, severe, has been using crutch to help weight bearing. 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 There is small faint conusion at arch, tender also at insertion of plantar fascia at calcaneus.   Occasional significant coughing fits since COVID recovery. He has also cut back on smoking! With this and diabetic neuropathy pain, he requesting intermittent leave FMLA for 3 days per month to take as needed when these medical conditions flare up.      ASSESSMENT & PLAN with other pertinent findings:  The primary encounter diagnosis was Contusion of plantar aspect of left foot, initial encounter. Diagnoses of Type 2 diabetes mellitus with diabetic neuropathy, with long-term current use of insulin (HCC) and Cough were also pertinent to this visit.    Patient Instructions  Plan:  See printed instructions for home exercises / stretches. I'm not convinced this is just due to plantar fasciitis but these exercises may help. If podiatry as different instructions, follow that advice!   Referral to podiatry  Boot now to immobilize for at least 3-5 days / as directed by podiatry   Work note for accomodations for now   Ask HR about FMLA for intermittent leave   Anti-inflammatory (meloxicam) take daily for 5-7 days then can keep on had for use as-needed after that  Xray today     Orders Placed This Encounter  Procedures   DG Foot Complete Left   Ambulatory referral to Podiatry    Meds ordered this encounter  Medications   meloxicam (MOBIC) 15 MG tablet    Sig: Take 1 tablet (15 mg total) by mouth daily as needed for pain.    Dispense:  30 tablet    Refill:  2       Follow-up instructions: Return for RECHECK PENDING PODIATRY  CONSULT / IF WORSE OR CHANGE BEFORE ABLE TO BE SEEN BY PODIATRY .                                         BP (!) 141/90 (BP Location: Right Arm, Patient Position: Sitting, Cuff Size: Large)    Pulse 93    Temp 98.2 F (36.8 C) (Oral)    Wt 237 lb (107.5 kg)    BMI 31.27 kg/m   Current Meds  Medication Sig   albuterol (VENTOLIN HFA) 108 (90 Base) MCG/ACT inhaler Inhale 2 puffs into the lungs every 6 (six) hours as needed for wheezing or shortness of breath (cough). Generic is fine   EPCLUSA 400-100 MG TABS Take 1 tablet by mouth daily.   gabapentin (NEURONTIN) 100 MG capsule Take 3-6 capsules (300-600 mg total) by mouth 3 (three) times daily as needed.   glucose blood (FREESTYLE TEST STRIPS) test strip Check blood sugar once a day, first thing in the morning.   insulin degludec (TRESIBA FLEXTOUCH) 200 UNIT/ML FlexTouch Pen Inject 40-100 Units into the skin at bedtime.   Insulin Pen Needle (PEN NEEDLES) 32G X 5 MM MISC Use as directed   ipratropium (ATROVENT) 0.03 % nasal spray Instill 2 sprays in each nostril every 12 hours.   ketoconazole (NIZORAL) 2 % shampoo Apply 1 application topically 2 (  two) times a week.   losartan (COZAAR) 50 MG tablet Take 1 tablet (50 mg total) by mouth daily.   metFORMIN (GLUCOPHAGE XR) 500 MG 24 hr tablet Take 1-2 tablets (500-1,000 mg total) by mouth daily.   ondansetron (ZOFRAN ODT) 4 MG disintegrating tablet Take 1 tablet (4 mg total) by mouth every 8 (eight) hours as needed for nausea or vomiting. Dissolve under tongue   predniSONE (DELTASONE) 20 MG tablet Take 1 tablet (20 mg total) by mouth 2 (two) times daily with a meal.   sildenafil (VIAGRA) 100 MG tablet Take 0.5-1 tablets (50-100 mg total) by mouth daily as needed for erectile dysfunction.   [DISCONTINUED] ibuprofen (ADVIL,MOTRIN) 200 MG tablet Take 400 mg by mouth every 6 (six) hours as needed for moderate pain.    No results found for this or any  previous visit (from the past 72 hour(s)).  No results found.     All questions at time of visit were answered - patient instructed to contact office with any additional concerns or updates.  ER/RTC precautions were reviewed with the patient as applicable.   Please note: voice recognition software was used to produce this document, and typos may escape review. Please contact Dr. Lyn Hollingshead for any needed clarifications.

## 2020-10-17 ENCOUNTER — Encounter: Payer: Self-pay | Admitting: Podiatry

## 2020-10-17 ENCOUNTER — Ambulatory Visit (INDEPENDENT_AMBULATORY_CARE_PROVIDER_SITE_OTHER): Payer: BC Managed Care – PPO

## 2020-10-17 ENCOUNTER — Other Ambulatory Visit: Payer: Self-pay

## 2020-10-17 ENCOUNTER — Ambulatory Visit (INDEPENDENT_AMBULATORY_CARE_PROVIDER_SITE_OTHER): Payer: BC Managed Care – PPO | Admitting: Podiatry

## 2020-10-17 DIAGNOSIS — M79672 Pain in left foot: Secondary | ICD-10-CM | POA: Diagnosis not present

## 2020-10-17 DIAGNOSIS — M722 Plantar fascial fibromatosis: Secondary | ICD-10-CM | POA: Diagnosis not present

## 2020-10-17 DIAGNOSIS — G8929 Other chronic pain: Secondary | ICD-10-CM

## 2020-10-17 NOTE — Patient Instructions (Signed)

## 2020-10-18 DIAGNOSIS — M722 Plantar fascial fibromatosis: Secondary | ICD-10-CM | POA: Diagnosis not present

## 2020-10-18 MED ORDER — TRIAMCINOLONE ACETONIDE 10 MG/ML IJ SUSP
10.0000 mg | Freq: Once | INTRAMUSCULAR | Status: AC
  Administered 2020-10-18: 15:00:00 10 mg

## 2020-10-18 NOTE — Progress Notes (Signed)
Subjective:   Patient ID: Calvin Long, male   DOB: 49 y.o.   MRN: [id number]   HPI 49 year old male presents the office today for concerns of heel pain was left side worse than right.  He states that he feels pain on the bottom of his left heel which has been ongoing now for the last week.  He previous had x-rays performed the left side which were negative.  He states that this may have been a workers comp issue and he did file a claim as he feels this started after he was using a " dead man's pedal" at work which likely caused the symptoms.  No recent injury or falls that he report.  He has no other concerns today.  He works at [employer].  Review of Systems  All other systems reviewed and are negative.  Past Medical History:  Diagnosis Date  . Diabetes mellitus     Past Surgical History:  Procedure Laterality Date  . NO PAST SURGERIES       Current Outpatient Medications:  .  albuterol (VENTOLIN HFA) 108 (90 Base) MCG/ACT inhaler, Inhale 2 puffs into the lungs every 6 (six) hours as needed for wheezing or shortness of breath (cough). Generic is fine, Disp: 8 g, Rfl: 1 .  benzonatate (TESSALON) 200 MG capsule, Take 1 capsule (200 mg total) by mouth 3 (three) times daily as needed for cough. (Patient not taking: Reported on 10/13/2020), Disp: 30 capsule, Rfl: 0 .  EPCLUSA 400-100 MG TABS, Take 1 tablet by mouth daily., Disp: , Rfl:  .  gabapentin (NEURONTIN) 100 MG capsule, Take 3-6 capsules (300-600 mg total) by mouth 3 (three) times daily as needed., Disp: 180 capsule, Rfl: 0 .  glucose blood (FREESTYLE TEST STRIPS) test strip, Check blood sugar once a day, first thing in the morning., Disp: 50 each, Rfl: 5 .  guaiFENesin-codeine 100-10 MG/5ML syrup, TAKE 5 TO 10 ML BY MOUTH  4 TIMES DAILY AS NEEDED FOR COUGH (Patient not taking: Reported on 10/13/2020), Disp: 180 mL, Rfl: 0 .  insulin degludec (TRESIBA FLEXTOUCH) 200 UNIT/ML FlexTouch Pen, Inject 40-100 Units into the skin at  bedtime., Disp: 3 mL, Rfl: 5 .  Insulin Pen Needle (PEN NEEDLES) 32G X 5 MM MISC, Use as directed, Disp: 100 each, Rfl: 99 .  ipratropium (ATROVENT) 0.03 % nasal spray, Instill 2 sprays in each nostril every 12 hours., Disp: 30 mL, Rfl: 1 .  ketoconazole (NIZORAL) 2 % shampoo, Apply 1 application topically 2 (two) times a week., Disp: 240 mL, Rfl: 1 .  losartan (COZAAR) 50 MG tablet, Take 1 tablet (50 mg total) by mouth daily., Disp: 90 tablet, Rfl: 3 .  meloxicam (MOBIC) 15 MG tablet, Take 1 tablet (15 mg total) by mouth daily as needed for pain., Disp: 30 tablet, Rfl: 2 .  metFORMIN (GLUCOPHAGE XR) 500 MG 24 hr tablet, Take 1-2 tablets (500-1,000 mg total) by mouth daily., Disp: 180 tablet, Rfl: 3 .  ondansetron (ZOFRAN ODT) 4 MG disintegrating tablet, Take 1 tablet (4 mg total) by mouth every 8 (eight) hours as needed for nausea or vomiting. Dissolve under tongue, Disp: 8 tablet, Rfl: 0 .  predniSONE (DELTASONE) 20 MG tablet, Take 1 tablet (20 mg total) by mouth 2 (two) times daily with a meal., Disp: 10 tablet, Rfl: 0 .  sildenafil (VIAGRA) 100 MG tablet, Take 0.5-1 tablets (50-100 mg total) by mouth daily as needed for erectile dysfunction., Disp: 10 tablet, Rfl: 11  No Known Allergies  Objective:  Physical Exam  General: AAO x3, NAD  Dermatological: Skin is warm, dry and supple bilateral. There are no open sores, no preulcerative lesions, no rash or signs of infection present.  Vascular: Dorsalis Pedis artery and Posterior Tibial artery pedal pulses are 2/4 bilateral with immedate capillary fill time. There is no pain with calf compression, swelling, warmth, erythema.   Neruologic: Grossly intact via light touch bilateral.   Musculoskeletal: There is tenderness palpation on the plantar medial tubercle of the calcaneus at the insertion of plantar fascia left side minimal edema.  There is no pain about compression of calcaneus.  No pain with Achilles tendon.  Flexor, extensor  tendons.  Intact.  Minimal discomfort in the right foot, plantar heel but not as significant as the left side.  Muscular strength 5/5 in all groups tested bilateral.  Gait: Unassisted, Nonantalgic.       Assessment:   Left foot heel pain, likely plantar fasciitis     Plan:  -Treatment options discussed including all alternatives, risks, and complications -Etiology of symptoms were discussed -Independent review the x-rays of the left side.  X-rays obtained and reviewed on the right foot.  There is no evidence of acute fracture identified. -Steroid injection performed.  See procedure note below. -Plantar fascial brace dispensed.  Has been wearing a surgical shoe and as he starts to feel better he can transition to regular shoe with the brace. -Stretching, icing exercises daily -Note provided to return to work on the 29th. -Consider orthotics next appointment  Procedure: Injection Tendon/Ligament Discussed alternatives, risks, complications and verbal consent was obtained.  Location: Left plantar fascia at the glabrous junction; medial approach. Skin Prep: Alcohol. Injectate: 0.5cc 0.5% marcaine plain, 0.5 cc 2% lidocaine plain and, 1 cc kenalog 10. Disposition: Patient tolerated procedure well. Injection site dressed with a band-aid.  Post-injection care was discussed and return precautions discussed.   Return in about 6 weeks (around 11/28/2020).  Vivi Barrack DPM

## 2020-10-19 ENCOUNTER — Encounter: Payer: Self-pay | Admitting: Podiatry

## 2020-10-30 ENCOUNTER — Encounter: Payer: Self-pay | Admitting: Podiatry

## 2020-11-01 ENCOUNTER — Telehealth: Payer: Self-pay

## 2020-11-01 NOTE — Telephone Encounter (Signed)
Please send script to [facility].  Thank you.

## 2020-11-01 NOTE — Telephone Encounter (Signed)
Calvin Long states he takes 6 100 mg capsules of Gabapentin 3 times daily. The last prescription was for 100 mg # 180. He is running out too soon and is unable to refill. Please advise.

## 2020-11-01 NOTE — Telephone Encounter (Signed)
Would he be ok if we sent this in a 300mg  capsules so he isn't having to take as many each day?

## 2020-11-02 ENCOUNTER — Other Ambulatory Visit: Payer: Self-pay | Admitting: Family Medicine

## 2020-11-02 MED ORDER — GABAPENTIN 300 MG PO CAPS
300.0000 mg | ORAL_CAPSULE | Freq: Three times a day (TID) | ORAL | 1 refills | Status: DC | PRN
Start: 2020-11-02 — End: 2020-11-03

## 2020-11-02 NOTE — Telephone Encounter (Signed)
Patient agrees to 300mg  capsules.

## 2020-11-03 ENCOUNTER — Other Ambulatory Visit: Payer: Self-pay | Admitting: *Deleted

## 2020-11-03 MED ORDER — GABAPENTIN 300 MG PO CAPS
300.0000 mg | ORAL_CAPSULE | Freq: Three times a day (TID) | ORAL | 1 refills | Status: DC | PRN
Start: 2020-11-03 — End: 2020-12-11

## 2020-11-04 ENCOUNTER — Encounter: Payer: Self-pay | Admitting: Osteopathic Medicine

## 2020-11-08 NOTE — Telephone Encounter (Signed)
Task completed by provider on 11/03/20. Updated rx sent to the pharmacy.

## 2020-11-24 ENCOUNTER — Encounter: Payer: Self-pay | Admitting: Osteopathic Medicine

## 2020-11-27 ENCOUNTER — Ambulatory Visit: Payer: BC Managed Care – PPO | Admitting: Podiatry

## 2020-11-29 ENCOUNTER — Encounter: Payer: Self-pay | Admitting: Osteopathic Medicine

## 2020-11-30 NOTE — Telephone Encounter (Signed)
Yes

## 2020-12-11 ENCOUNTER — Ambulatory Visit (INDEPENDENT_AMBULATORY_CARE_PROVIDER_SITE_OTHER): Payer: BC Managed Care – PPO | Admitting: Osteopathic Medicine

## 2020-12-11 ENCOUNTER — Ambulatory Visit (INDEPENDENT_AMBULATORY_CARE_PROVIDER_SITE_OTHER): Payer: BC Managed Care – PPO | Admitting: Podiatry

## 2020-12-11 ENCOUNTER — Encounter: Payer: Self-pay | Admitting: Osteopathic Medicine

## 2020-12-11 ENCOUNTER — Other Ambulatory Visit: Payer: Self-pay

## 2020-12-11 VITALS — BP 125/75 | HR 87 | Temp 98.8°F | Resp 20 | Ht 73.0 in | Wt 232.0 lb

## 2020-12-11 DIAGNOSIS — Z794 Long term (current) use of insulin: Secondary | ICD-10-CM

## 2020-12-11 DIAGNOSIS — E1140 Type 2 diabetes mellitus with diabetic neuropathy, unspecified: Secondary | ICD-10-CM | POA: Diagnosis not present

## 2020-12-11 DIAGNOSIS — U099 Post covid-19 condition, unspecified: Secondary | ICD-10-CM | POA: Diagnosis not present

## 2020-12-11 DIAGNOSIS — M722 Plantar fascial fibromatosis: Secondary | ICD-10-CM | POA: Diagnosis not present

## 2020-12-11 DIAGNOSIS — G629 Polyneuropathy, unspecified: Secondary | ICD-10-CM

## 2020-12-11 LAB — POCT GLYCOSYLATED HEMOGLOBIN (HGB A1C): Hemoglobin A1C: 7.3 % — AB (ref 4.0–5.6)

## 2020-12-11 MED ORDER — GABAPENTIN 300 MG PO CAPS
300.0000 mg | ORAL_CAPSULE | Freq: Three times a day (TID) | ORAL | 1 refills | Status: DC | PRN
Start: 2020-12-11 — End: 2021-03-06

## 2020-12-11 MED ORDER — SILDENAFIL CITRATE 100 MG PO TABS
50.0000 mg | ORAL_TABLET | Freq: Every day | ORAL | 11 refills | Status: DC | PRN
Start: 2020-12-11 — End: 2021-10-04

## 2020-12-11 MED ORDER — ALBUTEROL SULFATE HFA 108 (90 BASE) MCG/ACT IN AERS: 2.0000 | INHALATION_SPRAY | Freq: Four times a day (QID) | RESPIRATORY_TRACT | 11 refills | Status: DC | PRN

## 2020-12-11 NOTE — Progress Notes (Signed)
Calvin Long is a 50 y.o. male who presents to  [facility] at [facility]  today, 12/11/20, seeking care for the following:  . Stil having some SOB/chest tightness post COVID, albuterol helps temporarily   . DM2 follow-up o A1C today 7.3, other labs as below  o Last A1C 3 mos ago was 7.6, and 6 mos ago was 9.2. Good progress from last year when it was 11.1! o Last CMP renal fxn WNL      ASSESSMENT & PLAN with other pertinent findings:  The primary encounter diagnosis was Type 2 diabetes mellitus with diabetic neuropathy, with long-term current use of insulin (HCC). A diagnosis of Post covid-19 condition, unspecified was also pertinent to this visit.   DM2 stable Pt not taking ARB, BP ok, advised recommended for renal protection RTC PFT   There are no Patient Instructions on file for this visit.  Orders Placed This Encounter  Procedures  . POCT glycosylated hemoglobin (Hb A1C)    Meds ordered this encounter  Medications  . albuterol (VENTOLIN HFA) 108 (90 Base) MCG/ACT inhaler    Sig: Inhale 2 puffs into the lungs every 6 (six) hours as needed for wheezing or shortness of breath (cough). Generic is fine    Dispense:  18 g    Refill:  11  . sildenafil (VIAGRA) 100 MG tablet    Sig: Take 0.5-1 tablets (50-100 mg total) by mouth daily as needed for erectile dysfunction.    Dispense:  30 tablet    Refill:  11  . gabapentin (NEURONTIN) 300 MG capsule    Sig: Take 1-2 capsules (300-600 mg total) by mouth 3 (three) times daily as needed.    Dispense:  180 capsule    Refill:  1     See below for relevant physical exam findings  See below for recent lab and imaging results reviewed  Medications, allergies, PMH, PSH, SocH, FamH reviewed below    Follow-up instructions: Return for LUNG FUNCTION TESTING NEXT FEW WEEKS, VISIT IN 3 MOS W/ DR A TO MONITOR A1C  .                                        Exam:  BP 125/75 (BP Location: Left Arm, Cuff Size: Large)   Pulse 87   Temp 98.8 F (37.1 C) (Oral)   Resp 20   Ht 6\' 1"  (1.854 m)   Wt 232 lb (105.2 kg)   SpO2 99%   BMI 30.61 kg/m   Constitutional: VS see above. General Appearance: alert, well-developed, well-nourished, NAD  Neck: No masses, trachea midline.   Respiratory: Normal respiratory effort. no wheeze, no rhonchi, no rales  Cardiovascular: S1/S2 normal, no murmur, no rub/gallop auscultated. RRR.   Musculoskeletal: Gait normal. Symmetric and independent movement of all extremities  Neurological: Normal balance/coordination. No tremor.  Skin: warm, dry, intact.   Psychiatric: Normal judgment/insight. Normal mood and affect. Oriented x3.   Current Meds  Medication Sig  . EPCLUSA 400-100 MG TABS Take 1 tablet by mouth daily.  glucose blood (FREESTYLE TEST STRIPS) test strip Check blood sugar once a day, first thing in the morning.  Marland Kitchen guaiFENesin-codeine 100-10 MG/5ML syrup TAKE 5 TO 10 ML BY MOUTH  4 TIMES DAILY AS NEEDED FOR COUGH  . insulin degludec (TRESIBA FLEXTOUCH) 200 UNIT/ML FlexTouch Pen Inject 40-100 Units into the skin  at bedtime.  . Insulin Pen Needle (PEN NEEDLES) 32G X 5 MM MISC Use as directed  . ipratropium (ATROVENT) 0.03 % nasal spray Instill 2 sprays in each nostril every 12 hours.  Marland Kitchen ketoconazole (NIZORAL) 2 % shampoo Apply 1 application topically 2 (two) times a week.  . losartan (COZAAR) 50 MG tablet Take 1 tablet (50 mg total) by mouth daily.  . meloxicam (MOBIC) 15 MG tablet Take 1 tablet (15 mg total) by mouth daily as needed for pain.  . metFORMIN (GLUCOPHAGE XR) 500 MG 24 hr tablet Take 1-2 tablets (500-1,000 mg total) by mouth daily.  . ondansetron (ZOFRAN ODT) 4 MG disintegrating tablet Take 1 tablet (4 mg total) by mouth every 8 (eight) hours as needed for nausea or vomiting. Dissolve under tongue  .  predniSONE (DELTASONE) 20 MG tablet Take 1 tablet (20 mg total) by mouth 2 (two) times daily with a meal.  . [DISCONTINUED] albuterol (VENTOLIN HFA) 108 (90 Base) MCG/ACT inhaler Inhale 2 puffs into the lungs every 6 (six) hours as needed for wheezing or shortness of breath (cough). Generic is fine  . [DISCONTINUED] gabapentin (NEURONTIN) 300 MG capsule Take 1-2 capsules (300-600 mg total) by mouth 3 (three) times daily as needed.  . [DISCONTINUED] sildenafil (VIAGRA) 100 MG tablet Take 0.5-1 tablets (50-100 mg total) by mouth daily as needed for erectile dysfunction.    No Known Allergies  Patient Active Problem List   Diagnosis Date Noted  . COVID-19 08/24/2020  . Hepatitis C virus infection without hepatic coma 10/04/2019  . Paronychia of right thumb 09/03/2012  . Type 2 diabetes mellitus (HCC) 07/15/2012  . DKA, type 2, not at goal [facility]) 06/29/2012  . Dehydration 06/29/2012  . Hyponatremia 06/29/2012  . Tobacco abuse 06/29/2012  . Alcohol use 06/29/2012  . HTN (hypertension) 06/29/2012  . Skin rash 06/29/2012  . Thrombocytopenia (HCC) 06/29/2012    Family History  Problem Relation Age of Onset  . Diabetes Mother   . Diabetes Other   . Hypertension Other     Social History   Tobacco Use  Smoking Status Current Every Day Smoker  . Packs/day: 1.00  . Types: Cigarettes  Smokeless Tobacco Never Used    Past Surgical History:  Procedure Laterality Date  . NO PAST SURGERIES      Immunization History  Administered Date(s) Administered  . Hepatitis A, Ped/Adol-2 Dose 05/02/2020  . Hepatitis B, adult 05/02/2020, 05/29/2020  . PFIZER(Purple Top)SARS-COV-2 Vaccination 03/11/2020, 04/11/2020  . Pneumococcal Polysaccharide-23 07/01/2012  . Pneumococcal-Unspecified 07/01/2012  . Tdap 11/28/2016    Recent Results (from the past 2160 hour(s))  POCT glycosylated hemoglobin (Hb A1C)     Status: Abnormal   Collection Time: 12/11/20 11:08 AM  Result Value Ref Range    Hemoglobin A1C 7.3 (A) 4.0 - 5.6 %   HbA1c POC (<> result, manual entry)     HbA1c, POC (prediabetic range)     HbA1c, POC (controlled diabetic range)      No results found.     All questions at time of visit were answered - patient instructed to contact office with any additional concerns or updates. ER/RTC precautions were reviewed with the patient as applicable.   Please note: manual typing as well as voice recognition software may have been used to produce this document - typos may escape review. Please contact Dr. Lyn Hollingshead for any needed clarifications.

## 2020-12-15 NOTE — Progress Notes (Signed)
Subjective: 50 year old male presents the office today for follow-up evaluation of bilateral plantar fasciitis.  States a month and a functional standpoint he is doing much better.  He does describe the neuropathy is bothering him.  He is currently on gabapentin which the dose was just increased by his primary care physician.  He has no recent injury or trauma to his feet no other changes since I last saw him.  No recent falls. Denies any systemic complaints such as fevers, chills, nausea, vomiting. No acute changes since last appointment, and no other complaints at this time.   Last A1c was 7.3 on 12/11/2020  Objective: AAO x3, NAD DP/PT pulses palpable bilaterally, CRT less than 3 seconds There is minimal discomfort on plantar medial tubercle of the calcaneus at the insertion of plantar fascia.  Plantar fascial appears to be intact.  There is no pain with Achilles tendon.  No pain with lateral compression of calcaneus no other areas of discomfort.  MMT 5/5. No pain with calf compression, swelling, warmth, erythema  Assessment: 50 year old male with resolving plantar fasciitis; neuropathy  Plan: -All treatment options discussed with the patient including all alternatives, risks, complications.  -Gabapentin was just increased so would continue that dosage for now.  Discussed other treatment options should symptoms continue. -From a plantar fascia standpoint continue stretching, icing daily.  Discussed shoe modifications and orthotics.  We will check insurance coverage for him. Message sent to [facility]  -Patient encouraged to call the office with any questions, concerns, change in symptoms.   Vivi Barrack DPM

## 2020-12-23 ENCOUNTER — Encounter: Payer: Self-pay | Admitting: Osteopathic Medicine

## 2020-12-25 ENCOUNTER — Other Ambulatory Visit: Payer: Self-pay

## 2020-12-25 ENCOUNTER — Ambulatory Visit (INDEPENDENT_AMBULATORY_CARE_PROVIDER_SITE_OTHER): Payer: BC Managed Care – PPO | Admitting: Osteopathic Medicine

## 2020-12-25 ENCOUNTER — Encounter: Payer: Self-pay | Admitting: Osteopathic Medicine

## 2020-12-25 VITALS — BP 149/76 | HR 82 | Temp 98.2°F | Resp 20 | Ht 73.0 in | Wt 241.0 lb

## 2020-12-25 DIAGNOSIS — R0602 Shortness of breath: Secondary | ICD-10-CM | POA: Diagnosis not present

## 2020-12-25 DIAGNOSIS — R942 Abnormal results of pulmonary function studies: Secondary | ICD-10-CM | POA: Diagnosis not present

## 2020-12-25 MED ORDER — ALBUTEROL SULFATE HFA 108 (90 BASE) MCG/ACT IN AERS
4.0000 | INHALATION_SPRAY | Freq: Once | RESPIRATORY_TRACT | Status: AC
  Administered 2020-12-25: 4 via RESPIRATORY_TRACT

## 2020-12-25 NOTE — Patient Instructions (Signed)
Pulmonary Fibrosis  Pulmonary fibrosis is a type of lung disease that causes scarring. Over time, the scar tissue builds up in the air sacs of your lungs (alveoli). This makes it hard for you to breathe. Less oxygen can get into your blood. Scarring from pulmonary fibrosis gets worse over time. This damage is permanent and may lead to other serious health problems. What are the causes? There are many different causes of pulmonary fibrosis. Sometimes the cause is not known. This is called idiopathic pulmonary fibrosis. Other causes include:  Exposure to chemicals and substances found in agricultural, farm, construction, or factory work. These include mold, asbestos, silica, metal dusts, and toxic fumes.  Sarcoidosis. In this disease, areas of inflammatory cells (granulomas) form and most often affect the lungs.  Autoimmune diseases. These include diseases such as rheumatoid arthritis, systemic sclerosis, or connective tissue disease.  Taking certain medicines. These include drugs used in radiation therapy or used to treat seizures, heart problems, and some infections. What increases the risk? You are more likely to develop this condition if:  You have a family history of the disease.  You are older. The condition is more common in older adults.  You have a history of smoking.  You have a job that exposes you to certain chemicals.  You have gastroesophageal reflux disease (GERD). What are the signs or symptoms? Symptoms of this condition include:  Difficulty breathing that gets worse with activity.  Shortness of breath (dyspnea).  Dry, hacking cough.  Rapid, shallow breathing during exercise or while at rest.  Bluish skin and lips.  Loss of appetite.  Weakness.  Weight loss and fatigue.  Rounded and enlarged fingertips (clubbing). How is this diagnosed? This condition may be diagnosed based on:  Your symptoms and medical history.  A physical exam. You may also have  tests, including:  A test that involves looking inside your lungs with an instrument (bronchoscopy).  Imaging studies of your lungs and heart.  Tests to measure how well you are breathing (pulmonary function tests).  Blood tests.  Tests to see how well your lungs work while you are walking (pulmonary stress test).  A procedure to remove a lung tissue sample to look at it under a microscope (biopsy). How is this treated? There is no cure for pulmonary fibrosis. Treatment focuses on managing symptoms and preventing scarring from getting worse. This may include:  Medicines, such as: ? Steroids to prevent permanent lung changes. ? Medicines to suppress your body's defense system (immune system). ? Medicines to help with lung function by reducing inflammation or scarring.  Ongoing monitoring with X-rays and lab work.  Oxygen therapy.  Pulmonary rehabilitation.  Surgery. In some cases, a lung transplant is possible. Follow these instructions at home: Medicines  Take over-the-counter and prescription medicines only as told by your health care provider.  Keep your vaccinations up to date as recommended by your health care provider. General instructions  Do not use any products that contain nicotine or tobacco, such as cigarettes and e-cigarettes. If you need help quitting, ask your health care provider.  Get regular exercise, but do not overexert yourself. Ask your health care provider to suggest some activities that are safe for you to do. ? If you have physical limitations, you may get exercise by walking, using a stationary bike, or doing chair exercises. ? Ask your health care provider about using oxygen while exercising.  If you are exposed to chemicals and substances at work, make sure that you   wear a mask or respirator at all times.  Join a pulmonary rehabilitation program or a support group for people with pulmonary fibrosis.  Eat small meals often so you do not get too  full. Overeating can make breathing trouble worse.  Maintain a healthy weight. Lose weight if you need to.  Do breathing exercises as directed by your health care provider.  Keep all follow-up visits as told by your health care provider. This is important.      Contact a health care provider if you:  Have symptoms that do not get better with medicines.  Are not able to be as active as usual.  Have trouble taking a deep breath.  Have a fever or chills.  Have blue lips or skin.  Have clubbing of your fingers. Get help right away if you:  Have a sudden worsening of your symptoms.  Have chest pain.  Cough up mucus that is dark in color.  Have a lot of headaches.  Get very confused or sleepy. Summary  Pulmonary fibrosis is a type of lung disease that causes scar tissue to build up in the air sacs of your lungs (alveoli) over time. Less oxygen can get into your blood. This makes it hard for you to breathe.  Scarring from pulmonary fibrosis gets worse over time. This damage is permanent and may lead to other serious health problems.  You are more likely to develop this condition if you have a family history of the condition or a job that exposes you to certain chemicals.  There is no cure for pulmonary fibrosis. Treatment focuses on managing symptoms and preventing scarring from getting worse. This information is not intended to replace advice given to you by your health care provider. Make sure you discuss any questions you have with your health care provider. Document Revised: 11/19/2017 Document Reviewed: 11/19/2017 Elsevier Patient Education  2021 Elsevier Inc.  

## 2020-12-25 NOTE — Progress Notes (Signed)
PFT INTERPRETATION  FEV1/FVC >70 *AND*  FEV1 >80% predicted  = NORMAL SPIROMETRY NORMAL? no  1. Valid study? yes 2. Flow-Volume Loop: normal 3. FEV1/FVC: >70  >70 = Normal  <70 = Obstructive    = COPD n/a 3. Severity of Obstruction ~ Post-Bronchodilator FEV1: n/a, nonobstructive  5. Bronchodilator Challenge  Increase FEV1 or FVC by 200+mL? no *AND*  Increased same FEV1 or FVC by 12+%? no  Reversible? no 6. FVC <80% = Restrictive yes 7. Lung Volumes: unable to measure TLC here 8. Diffusion Capacity - refer to Pulm needed? yes    Exam:  BP (!) 149/76   Pulse 82   Temp 98.2 F (36.8 C) (Oral)   Resp 20   Ht 6\' 1"  (1.854 m)   Wt 241 lb (109.3 kg)   SpO2 99%   BMI 31.80 kg/m  Constitutional:  . VSS, see nurse notes . General Appearance: alert, well-developed, well-nourished, NAD Neck: . No masses, trachea midline Respiratory: . Normal respiratory effort . Breath sounds normal, no wheeze/rhonchi/rales Psychiatric: . Normal judgment/insight . Normal mood and affect     A/P 1. Shortness of breath 2. Abnormal PFTs (pulmonary function tests) --> refer pulmonary

## 2021-01-11 ENCOUNTER — Telehealth: Payer: Self-pay | Admitting: Podiatry

## 2021-01-11 NOTE — Telephone Encounter (Signed)
Left voice message to schedule appt to pick up orthotics.  

## 2021-01-16 ENCOUNTER — Encounter: Payer: Self-pay | Admitting: Pulmonary Disease

## 2021-01-16 ENCOUNTER — Ambulatory Visit (INDEPENDENT_AMBULATORY_CARE_PROVIDER_SITE_OTHER): Payer: BC Managed Care – PPO | Admitting: Pulmonary Disease

## 2021-01-16 ENCOUNTER — Other Ambulatory Visit: Payer: Self-pay

## 2021-01-16 ENCOUNTER — Encounter: Payer: Self-pay | Admitting: Osteopathic Medicine

## 2021-01-16 ENCOUNTER — Ambulatory Visit (INDEPENDENT_AMBULATORY_CARE_PROVIDER_SITE_OTHER): Payer: BC Managed Care – PPO

## 2021-01-16 VITALS — BP 132/90 | HR 111 | Temp 97.2°F | Ht 73.0 in | Wt 231.2 lb

## 2021-01-16 DIAGNOSIS — R0602 Shortness of breath: Secondary | ICD-10-CM

## 2021-01-16 MED ORDER — BREO ELLIPTA 100-25 MCG/INH IN AEPB: 1.0000 | INHALATION_SPRAY | Freq: Every day | RESPIRATORY_TRACT | 3 refills | Status: AC

## 2021-01-16 NOTE — Patient Instructions (Signed)
Shortness of breath Fatigue  We will get a breathing study on you Echocardiogram to assess the heart Chest x-ray to assess lung structure -If chest x-ray is abnormal we may need to get a CT scan of the chest -Your CT scan a couple of years ago was normal  Breo inhaler to help with shortness of breath  I will see you in 4 to 6 weeks  Pacing yourself little bit more may help with the fatigue

## 2021-01-16 NOTE — Progress Notes (Signed)
Calvin Long    [id number]    [birth year]  Primary Care Physician:Alexander, Dorene Grebe, DO  Referring Physician: Sunnie Nielsen, DO [address] [place],  [place] [address]  Chief complaint:   Patient seen today for fatigue and shortness of breath  HPI:  Patient did recently have Covid in October Was not hospitalized It was sick for about 3 weeks Took him about a month to recover fully  Still has shortness of breath, chest tightness General fatigue  Has been missing days of work recently just from not been able to function well at work  It was a very active smoker about half a pack to a pack a day now down to about a couple of cigarettes a day Continues to work on trying to quit  He does have an occasional cough, occasional gray phlegm  Uses albuterol as needed but sometimes this makes him jittery  He has periods when he feels of will and then he may have a few days when he does not feel well at all  Symptoms have been ongoing for 4 to 5 months now  He does have occasional headaches   Outpatient Encounter Medications as of 01/16/2021  Medication Sig  . albuterol (VENTOLIN HFA) 108 (90 Base) MCG/ACT inhaler Inhale 2 puffs into the lungs every 6 (six) hours as needed for wheezing or shortness of breath (cough). Generic is fine  . EPCLUSA 400-100 MG TABS Take 1 tablet by mouth daily.  Marland Kitchen gabapentin (NEURONTIN) 300 MG capsule Take 1-2 capsules (300-600 mg total) by mouth 3 (three) times daily as needed.  Marland Kitchen glucose blood (FREESTYLE TEST STRIPS) test strip Check blood sugar once a day, first thing in the morning.  Marland Kitchen guaiFENesin-codeine 100-10 MG/5ML syrup TAKE 5 TO 10 ML BY MOUTH  4 TIMES DAILY AS NEEDED FOR COUGH  . insulin degludec (TRESIBA FLEXTOUCH) 200 UNIT/ML FlexTouch Pen Inject 40-100 Units into the skin at bedtime.  . Insulin Pen Needle (PEN NEEDLES) 32G X 5 MM MISC Use as directed  . ipratropium (ATROVENT) 0.03 % nasal spray Instill 2  sprays in each nostril every 12 hours.  Marland Kitchen ketoconazole (NIZORAL) 2 % shampoo Apply 1 application topically 2 (two) times a week.  . losartan (COZAAR) 50 MG tablet Take 1 tablet (50 mg total) by mouth daily.  . meloxicam (MOBIC) 15 MG tablet Take 1 tablet (15 mg total) by mouth daily as needed for pain.  . metFORMIN (GLUCOPHAGE XR) 500 MG 24 hr tablet Take 1-2 tablets (500-1,000 mg total) by mouth daily.  . ondansetron (ZOFRAN ODT) 4 MG disintegrating tablet Take 1 tablet (4 mg total) by mouth every 8 (eight) hours as needed for nausea or vomiting. Dissolve under tongue  . predniSONE (DELTASONE) 20 MG tablet Take 1 tablet (20 mg total) by mouth 2 (two) times daily with a meal.  . sildenafil (VIAGRA) 100 MG tablet Take 0.5-1 tablets (50-100 mg total) by mouth daily as needed for erectile dysfunction.   No facility-administered encounter medications on file as of 01/16/2021.    Allergies as of 01/16/2021  . (No Known Allergies)    Past Medical History:  Diagnosis Date  . Diabetes mellitus     Past Surgical History:  Procedure Laterality Date  . NO PAST SURGERIES      Family History  Problem Relation Age of Onset  . Diabetes Mother   . Diabetes Other   . Hypertension Other  Social History   Socioeconomic History  . Marital status: Single    Spouse name: Not on file  . Number of children: Not on file  . Years of education: Not on file  . Highest education level: Not on file  Occupational History  . Not on file  Tobacco Use  . Smoking status: Current Every Day Smoker    Packs/day: 1.00    Types: Cigarettes  . Smokeless tobacco: Never Used  Vaping Use  . Vaping Use: Never used  Substance and Sexual Activity  . Alcohol use: Yes  . Drug use: No  . Sexual activity: Yes    Partners: Female  Other Topics Concern  . Not on file  Social History Narrative  . Not on file   Social Determinants of Health   Financial Resource Strain: Not on file  Food Insecurity: Not on  file  Transportation Needs: Not on file  Physical Activity: Not on file  Stress: Not on file  Social Connections: Not on file  Intimate Partner Violence: Not on file    Review of Systems  Constitutional: Positive for fatigue.  Respiratory: Positive for cough, choking and shortness of breath.   Neurological: Positive for headaches.    Vitals:   01/16/21 1018  BP: 132/90  Pulse: (!) 111  Temp: (!) 97.2 F (36.2 C)  SpO2: 99%     Physical Exam Constitutional:      Appearance: Normal appearance.  HENT:     Head: Normocephalic.     Nose: No congestion or rhinorrhea.     Mouth/Throat:     Mouth: Mucous membranes are moist.  Eyes:     General: No scleral icterus.       Right eye: No discharge.        Left eye: No discharge.     Pupils: Pupils are equal, round, and reactive to light.  Cardiovascular:     Rate and Rhythm: Normal rate and regular rhythm.     Heart sounds: No murmur heard. No friction rub.  Pulmonary:     Effort: Pulmonary effort is normal. No respiratory distress.     Breath sounds: Normal breath sounds. No stridor. No wheezing or rhonchi.  Musculoskeletal:     Cervical back: No rigidity or tenderness.  Neurological:     Mental Status: He is alert.  Psychiatric:        Mood and Affect: Mood normal.    Data Reviewed: No recent chest x-ray  Assessment:  Shortness of breath  Chronic fatigue  History of Covid October 2021  Plan/Recommendations: Empiric Breo  Follow-up on chest x-ray if abnormal may need CT scan of the chest  Order echocardiogram to evaluate for cardiac cause of balance of breath  Graded exercises as tolerated  Tentative follow-up in 4 to 6 weeks   Virl Diamond MD [facility] 01/16/2021, 10:44 AM  CC: Sunnie Nielsen, DO

## 2021-01-19 ENCOUNTER — Telehealth: Payer: Self-pay

## 2021-01-19 NOTE — Telephone Encounter (Signed)
Per [employer] Pill pack - Losartan experiencing supply shortage. Requesting an alternative rx for patient.   Olmesartan 10 mg, 20 mg, 40mg   Candesartan 4 mg, 8mg , 32 mg

## 2021-01-22 MED ORDER — OLMESARTAN MEDOXOMIL 20 MG PO TABS: 20.0000 mg | ORAL_TABLET | Freq: Every day | ORAL | 0 refills | Status: DC

## 2021-02-02 ENCOUNTER — Other Ambulatory Visit: Payer: Self-pay | Admitting: Osteopathic Medicine

## 2021-02-02 DIAGNOSIS — E1169 Type 2 diabetes mellitus with other specified complication: Secondary | ICD-10-CM

## 2021-02-08 ENCOUNTER — Other Ambulatory Visit: Payer: Self-pay

## 2021-02-08 MED ORDER — MELOXICAM 15 MG PO TABS: 15.0000 mg | ORAL_TABLET | Freq: Every day | ORAL | 2 refills | Status: DC | PRN

## 2021-02-08 NOTE — Telephone Encounter (Signed)
Placed in fax box

## 2021-02-08 NOTE — Telephone Encounter (Signed)
Routing to vanicia as fyi

## 2021-02-13 LAB — HM DIABETES EYE EXAM

## 2021-03-05 ENCOUNTER — Other Ambulatory Visit: Payer: Self-pay | Admitting: Osteopathic Medicine

## 2021-03-05 ENCOUNTER — Ambulatory Visit: Payer: BC Managed Care – PPO | Admitting: Pulmonary Disease

## 2021-03-12 ENCOUNTER — Ambulatory Visit: Payer: BC Managed Care – PPO | Admitting: Osteopathic Medicine

## 2021-03-12 ENCOUNTER — Ambulatory Visit (HOSPITAL_COMMUNITY): Payer: BC Managed Care – PPO | Attending: Cardiology

## 2021-03-12 ENCOUNTER — Encounter (HOSPITAL_COMMUNITY): Payer: Self-pay | Admitting: Pulmonary Disease

## 2021-03-12 ENCOUNTER — Encounter (HOSPITAL_COMMUNITY): Payer: Self-pay

## 2021-03-12 NOTE — Progress Notes (Signed)
Verified appointment "no show" status with Wallace Cullens at 11:28.

## 2021-03-13 ENCOUNTER — Ambulatory Visit: Payer: BC Managed Care – PPO | Admitting: Osteopathic Medicine

## 2021-03-19 ENCOUNTER — Ambulatory Visit: Payer: BC Managed Care – PPO | Admitting: Podiatry

## 2021-03-27 ENCOUNTER — Telehealth (HOSPITAL_COMMUNITY): Payer: Self-pay | Admitting: Pulmonary Disease

## 2021-03-27 NOTE — Telephone Encounter (Signed)
Just an FYI. We have made several attempts to contact this patient including sending a letter to schedule or reschedule their echocardiogram. We will be removing the patient from the echo WQ.   03/12/21 NO SHOWED-MAILED LETTER AND LVM to call office to reschedule/LBW 11:52  01/25/21 [facility] to schedule @ 10:10am/LBW  01/24/21 [facility] to schedule @ 11:33/LBW     Thank you

## 2021-03-27 NOTE — Telephone Encounter (Signed)
[place]

## 2021-04-02 ENCOUNTER — Other Ambulatory Visit: Payer: Self-pay

## 2021-04-02 ENCOUNTER — Ambulatory Visit (INDEPENDENT_AMBULATORY_CARE_PROVIDER_SITE_OTHER): Payer: BC Managed Care – PPO | Admitting: Osteopathic Medicine

## 2021-04-02 ENCOUNTER — Encounter: Payer: Self-pay | Admitting: Osteopathic Medicine

## 2021-04-02 VITALS — BP 143/79 | HR 82 | Temp 98.5°F | Wt 233.0 lb

## 2021-04-02 DIAGNOSIS — J329 Chronic sinusitis, unspecified: Secondary | ICD-10-CM

## 2021-04-02 DIAGNOSIS — E1140 Type 2 diabetes mellitus with diabetic neuropathy, unspecified: Secondary | ICD-10-CM

## 2021-04-02 DIAGNOSIS — R252 Cramp and spasm: Secondary | ICD-10-CM | POA: Diagnosis not present

## 2021-04-02 DIAGNOSIS — Z794 Long term (current) use of insulin: Secondary | ICD-10-CM

## 2021-04-02 LAB — POCT GLYCOSYLATED HEMOGLOBIN (HGB A1C): Hemoglobin A1C: 8.2 % — AB (ref 4.0–5.6)

## 2021-04-02 MED ORDER — OZEMPIC (0.25 OR 0.5 MG/DOSE) 2 MG/1.5ML ~~LOC~~ SOPN: PEN_INJECTOR | SUBCUTANEOUS | 1 refills | Status: AC

## 2021-04-02 MED ORDER — CYCLOBENZAPRINE HCL 10 MG PO TABS: 5.0000 mg | ORAL_TABLET | Freq: Three times a day (TID) | ORAL | 1 refills | Status: DC | PRN

## 2021-04-02 MED ORDER — AMOXICILLIN-POT CLAVULANATE 875-125 MG PO TABS: 1.0000 | ORAL_TABLET | Freq: Two times a day (BID) | ORAL | 0 refills | Status: AC

## 2021-04-02 NOTE — Progress Notes (Signed)
Calvin Long is a 50 y.o. male who presents to  [facility]  today, 04/02/21, seeking care for the following:  . Follow-up DM2 o Last A1C three mos ago was 7.3 o Today 04/02/21:  8.2, notes there is room for improvement in diet, he has been a bit less active lately. Concerned about weight gain.    Sinus pain ongoing few weeks at this point, thinks allergy mucus has predisposed him to bacterial infection, feels significant pressure between eyes    ASSESSMENT & PLAN with other pertinent findings:  The primary encounter diagnosis was Type 2 diabetes mellitus with diabetic neuropathy, with long-term current use of insulin (HCC). Diagnoses of Sinusitis, unspecified chronicity, unspecified location and Muscle cramp were also pertinent to this visit.   Results for orders placed or performed in visit on 04/02/21 (from the past 24 hour(s))  POCT HgB A1C     Status: Abnormal   Collection Time: 04/02/21  1:38 PM  Result Value Ref Range   Hemoglobin A1C 8.2 (A) 4.0 - 5.6 %   HbA1c POC (<> result, manual entry)     HbA1c, POC (prediabetic range)     HbA1c, POC (controlled diabetic range)      Patient Instructions  Reduce Tresiba from 100 units to 90 units daily Work on increase activity, reduce carbs in diet  Will start Ozempic to help w/ A1C and weight   Rx antibiotics for sinus infection If not better, let me know  Rx sent for cyclobenzaprine muscle relaxer    Orders Placed This Encounter  Procedures  . POCT HgB A1C    Meds ordered this encounter  Medications  . Semaglutide,0.25 or 0.5MG /DOS, (OZEMPIC, 0.25 OR 0.5 MG/DOSE,) 2 MG/1.5ML SOPN    Sig: Inject 0.25 mg into the skin once a week for 30 days, THEN 0.5 mg once a week.    Dispense:  7.5 mL    Refill:  1  . amoxicillin-clavulanate (AUGMENTIN) 875-125 MG tablet    Sig: Take 1 tablet by mouth 2 (two) times daily for 7 days.    Dispense:  14 tablet    Refill:  0   . cyclobenzaprine (FLEXERIL) 10 MG tablet    Sig: Take 0.5-1 tablets (5-10 mg total) by mouth 3 (three) times daily as needed for muscle spasms. Caution: can cause drowsiness    Dispense:  60 tablet    Refill:  1     See below for relevant physical exam findings  See below for recent lab and imaging results reviewed  Medications, allergies, PMH, PSH, SocH, FamH reviewed below    Follow-up instructions: Return in about 3 months (around 07/03/2021) for MONITOR A1C .                                        Exam:  BP (!) 143/79 (BP Location: Left Arm, Patient Position: Sitting, Cuff Size: Large)   Pulse 82   Temp 98.5 F (36.9 C) (Oral)   Wt 233 lb 0.6 oz (105.7 kg)   BMI 30.75 kg/m   Constitutional: VS see above. General Appearance: alert, well-developed, well-nourished, NAD  Neck: No masses, trachea midline.   Respiratory: Normal respiratory effort. no wheeze, no rhonchi, no rales  Cardiovascular: S1/S2 normal, no murmur, no rub/gallop auscultated. RRR.   Musculoskeletal: Gait normal. Symmetric and independent movement of all extremities  Abdominal:  non-tender, non-distended, no appreciable organomegaly, neg Murphy's, BS WNLx4  Neurological: Normal balance/coordination. No tremor.  Skin: warm, dry, intact.   Psychiatric: Normal judgment/insight. Normal mood and affect. Oriented x3.   Current Meds  Medication Sig  . albuterol (VENTOLIN HFA) 108 (90 Base) MCG/ACT inhaler Inhale 2 puffs into the lungs every 6 (six) hours as needed for wheezing or shortness of breath (cough). Generic is fine  . amoxicillin-clavulanate (AUGMENTIN) 875-125 MG tablet Take 1 tablet by mouth 2 (two) times daily for 7 days.  . cyclobenzaprine (FLEXERIL) 10 MG tablet Take 0.5-1 tablets (5-10 mg total) by mouth 3 (three) times daily as needed for muscle spasms. Caution: can cause drowsiness  . fluticasone furoate-vilanterol (BREO ELLIPTA) 100-25 MCG/INH AEPB  Inhale 1 puff into the lungs daily.  Marland Kitchen gabapentin (NEURONTIN) 300 MG capsule TAKE 1 TO 2 CAPSULES BY MOUTH THREE TIMES DAILY AS NEEDED  . glucose blood (FREESTYLE TEST STRIPS) test strip Check blood sugar once a day, first thing in the morning.  . Insulin Pen Needle (PEN NEEDLES) 32G X 5 MM MISC Use as directed  . ketoconazole (NIZORAL) 2 % shampoo Apply 1 application topically 2 (two) times a week.  . meloxicam (MOBIC) 15 MG tablet Take 1 tablet (15 mg total) by mouth daily as needed for pain.  . metFORMIN (GLUCOPHAGE XR) 500 MG 24 hr tablet Take 1-2 tablets (500-1,000 mg total) by mouth daily.  Marland Kitchen olmesartan (BENICAR) 20 MG tablet Take 1 tablet (20 mg total) by mouth daily.  . Semaglutide,0.25 or 0.5MG /DOS, (OZEMPIC, 0.25 OR 0.5 MG/DOSE,) 2 MG/1.5ML SOPN Inject 0.25 mg into the skin once a week for 30 days, THEN 0.5 mg once a week.  . sildenafil (VIAGRA) 100 MG tablet Take 0.5-1 tablets (50-100 mg total) by mouth daily as needed for erectile dysfunction.  Evaristo Bury FLEXTOUCH 200 UNIT/ML FlexTouch Pen Inject 40 to 100 units subcutaneously at bedtime.  . [DISCONTINUED] ipratropium (ATROVENT) 0.03 % nasal spray Instill 2 sprays in each nostril every 12 hours.  . [DISCONTINUED] ondansetron (ZOFRAN ODT) 4 MG disintegrating tablet Take 1 tablet (4 mg total) by mouth every 8 (eight) hours as needed for nausea or vomiting. Dissolve under tongue    No Known Allergies  Patient Active Problem List   Diagnosis Date Noted  . COVID-19 08/24/2020  . Hepatitis C virus infection without hepatic coma 10/04/2019  . Paronychia of right thumb 09/03/2012  . Type 2 diabetes mellitus (HCC) 07/15/2012  . DKA, type 2, not at goal [facility]) 06/29/2012  . Dehydration 06/29/2012  . Hyponatremia 06/29/2012  . Tobacco abuse 06/29/2012  . Alcohol use 06/29/2012  . HTN (hypertension) 06/29/2012  . Skin rash 06/29/2012  . Thrombocytopenia (HCC) 06/29/2012    Family History  Problem Relation Age of Onset  . Diabetes  Mother   . Diabetes Other   . Hypertension Other     Social History   Tobacco Use  Smoking Status Current Every Day Smoker  . Packs/day: 1.00  . Types: Cigarettes  Smokeless Tobacco Never Used    Past Surgical History:  Procedure Laterality Date  . NO PAST SURGERIES      Immunization History  Administered Date(s) Administered  . Hepatitis A, Ped/Adol-2 Dose 05/02/2020  . Hepatitis B, adult 05/02/2020, 05/29/2020  . PFIZER(Purple Top)SARS-COV-2 Vaccination 03/11/2020, 04/11/2020  . Pneumococcal Polysaccharide-23 07/01/2012  . Pneumococcal-Unspecified 07/01/2012  . Tdap 11/28/2016    Recent Results (from the past 2160 hour(s))  POCT HgB A1C     Status: Abnormal  Collection Time: 04/02/21  1:38 PM  Result Value Ref Range   Hemoglobin A1C 8.2 (A) 4.0 - 5.6 %   HbA1c POC (<> result, manual entry)     HbA1c, POC (prediabetic range)     HbA1c, POC (controlled diabetic range)      No results found.     All questions at time of visit were answered - patient instructed to contact office with any additional concerns or updates. ER/RTC precautions were reviewed with the patient as applicable.   Please note: manual typing as well as voice recognition software may have been used to produce this document - typos may escape review. Please contact Dr. Lyn Hollingshead for any needed clarifications.

## 2021-04-02 NOTE — Patient Instructions (Signed)
Reduce Tresiba from 100 units to 90 units daily Work on increase activity, reduce carbs in diet  Will start Ozempic to help w/ A1C and weight   Rx antibiotics for sinus infection If not better, let me know  Rx sent for cyclobenzaprine muscle relaxer

## 2021-04-03 ENCOUNTER — Encounter: Payer: Self-pay | Admitting: Osteopathic Medicine

## 2021-04-08 ENCOUNTER — Other Ambulatory Visit: Payer: Self-pay | Admitting: Osteopathic Medicine

## 2021-04-10 ENCOUNTER — Encounter: Payer: Self-pay | Admitting: Osteopathic Medicine

## 2021-04-29 ENCOUNTER — Other Ambulatory Visit: Payer: Self-pay | Admitting: Osteopathic Medicine

## 2021-06-11 ENCOUNTER — Telehealth: Payer: Self-pay | Admitting: General Practice

## 2021-06-11 NOTE — Telephone Encounter (Signed)
Transition Care Management Follow-up Telephone Call Date of discharge and from where: 06/10/21 from [facility] How have you been since you were released from the hospital? Doing better. Any questions or concerns? No  Items Reviewed: Did the pt receive and understand the discharge instructions provided? Yes  Medications obtained and verified? No  Other? No  Any new allergies since your discharge? No  Dietary orders reviewed? Yes Do you have support at home? Yes   Home Care and Equipment/Supplies: Were home health services ordered? no   Functional Questionnaire: (I = Independent and D = Dependent) ADLs: I  Bathing/Dressing- I  Meal Prep- I  Eating- I  Maintaining continence- I  Transferring/Ambulation- I  Managing Meds- I  Follow up appointments reviewed:  PCP Hospital f/u appt confirmed? Patient left a voicemail to schedule an appointment with Dr. Lyn Hollingshead. Specialist Hospital f/u appt confirmed? No   Are transportation arrangements needed? No  If their condition worsens, is the pt aware to call PCP or go to the Emergency Dept.? Yes Was the patient provided with contact information for the PCP's office or ED? Yes Was to pt encouraged to call back with questions or concerns? Yes

## 2021-07-03 ENCOUNTER — Ambulatory Visit: Payer: BC Managed Care – PPO | Admitting: Osteopathic Medicine

## 2021-07-07 ENCOUNTER — Other Ambulatory Visit: Payer: Self-pay | Admitting: Osteopathic Medicine

## 2021-07-07 ENCOUNTER — Other Ambulatory Visit: Payer: Self-pay | Admitting: Medical-Surgical

## 2021-08-01 ENCOUNTER — Other Ambulatory Visit: Payer: Self-pay | Admitting: Osteopathic Medicine

## 2021-08-14 IMAGING — DX DG CHEST 2V
2 series · 2 of 2 positions shown · non-contrast
Comparison: None.

CLINICAL DATA: Short of breath.

EXAM:
CHEST - 2 VIEW

[chest pa]
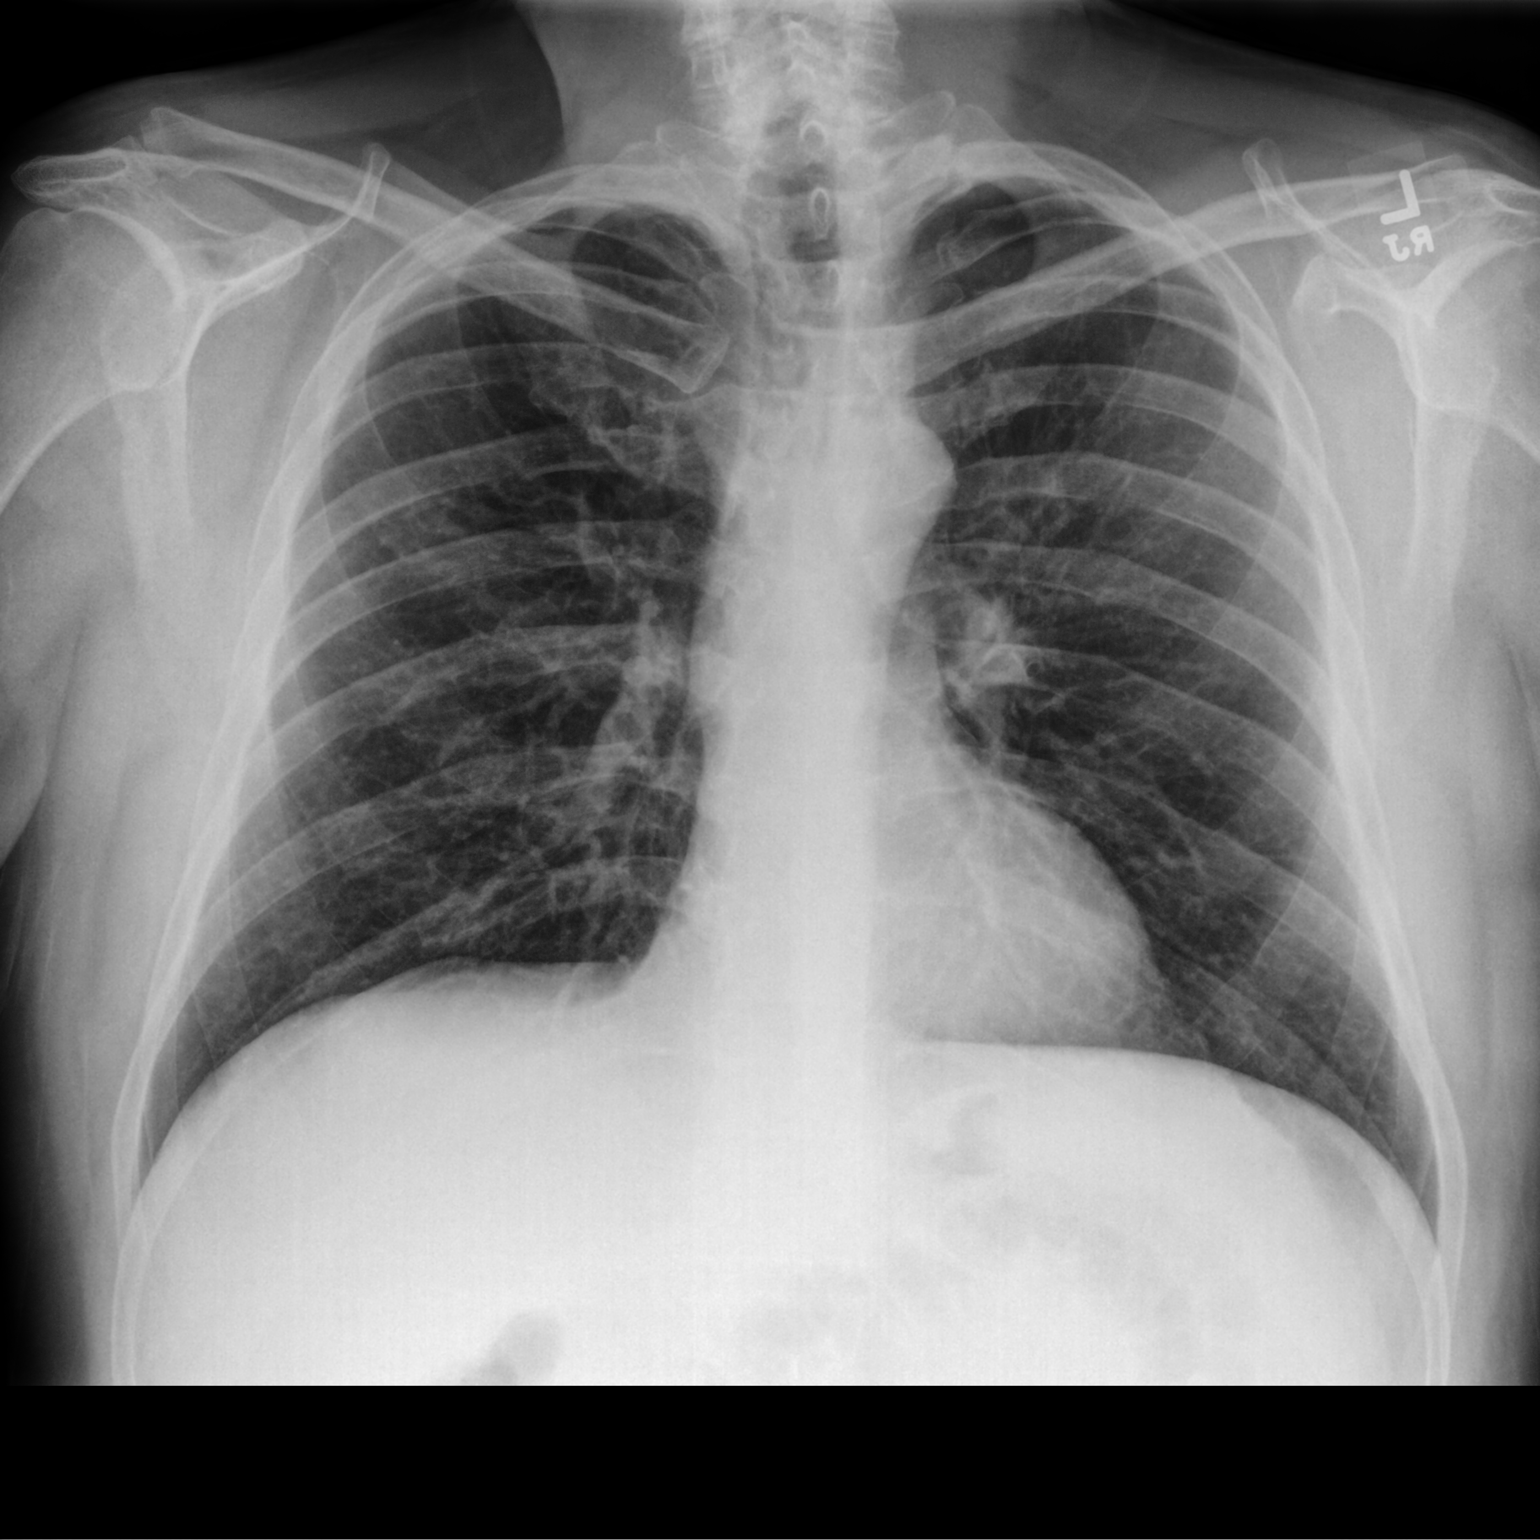

[chest lat]
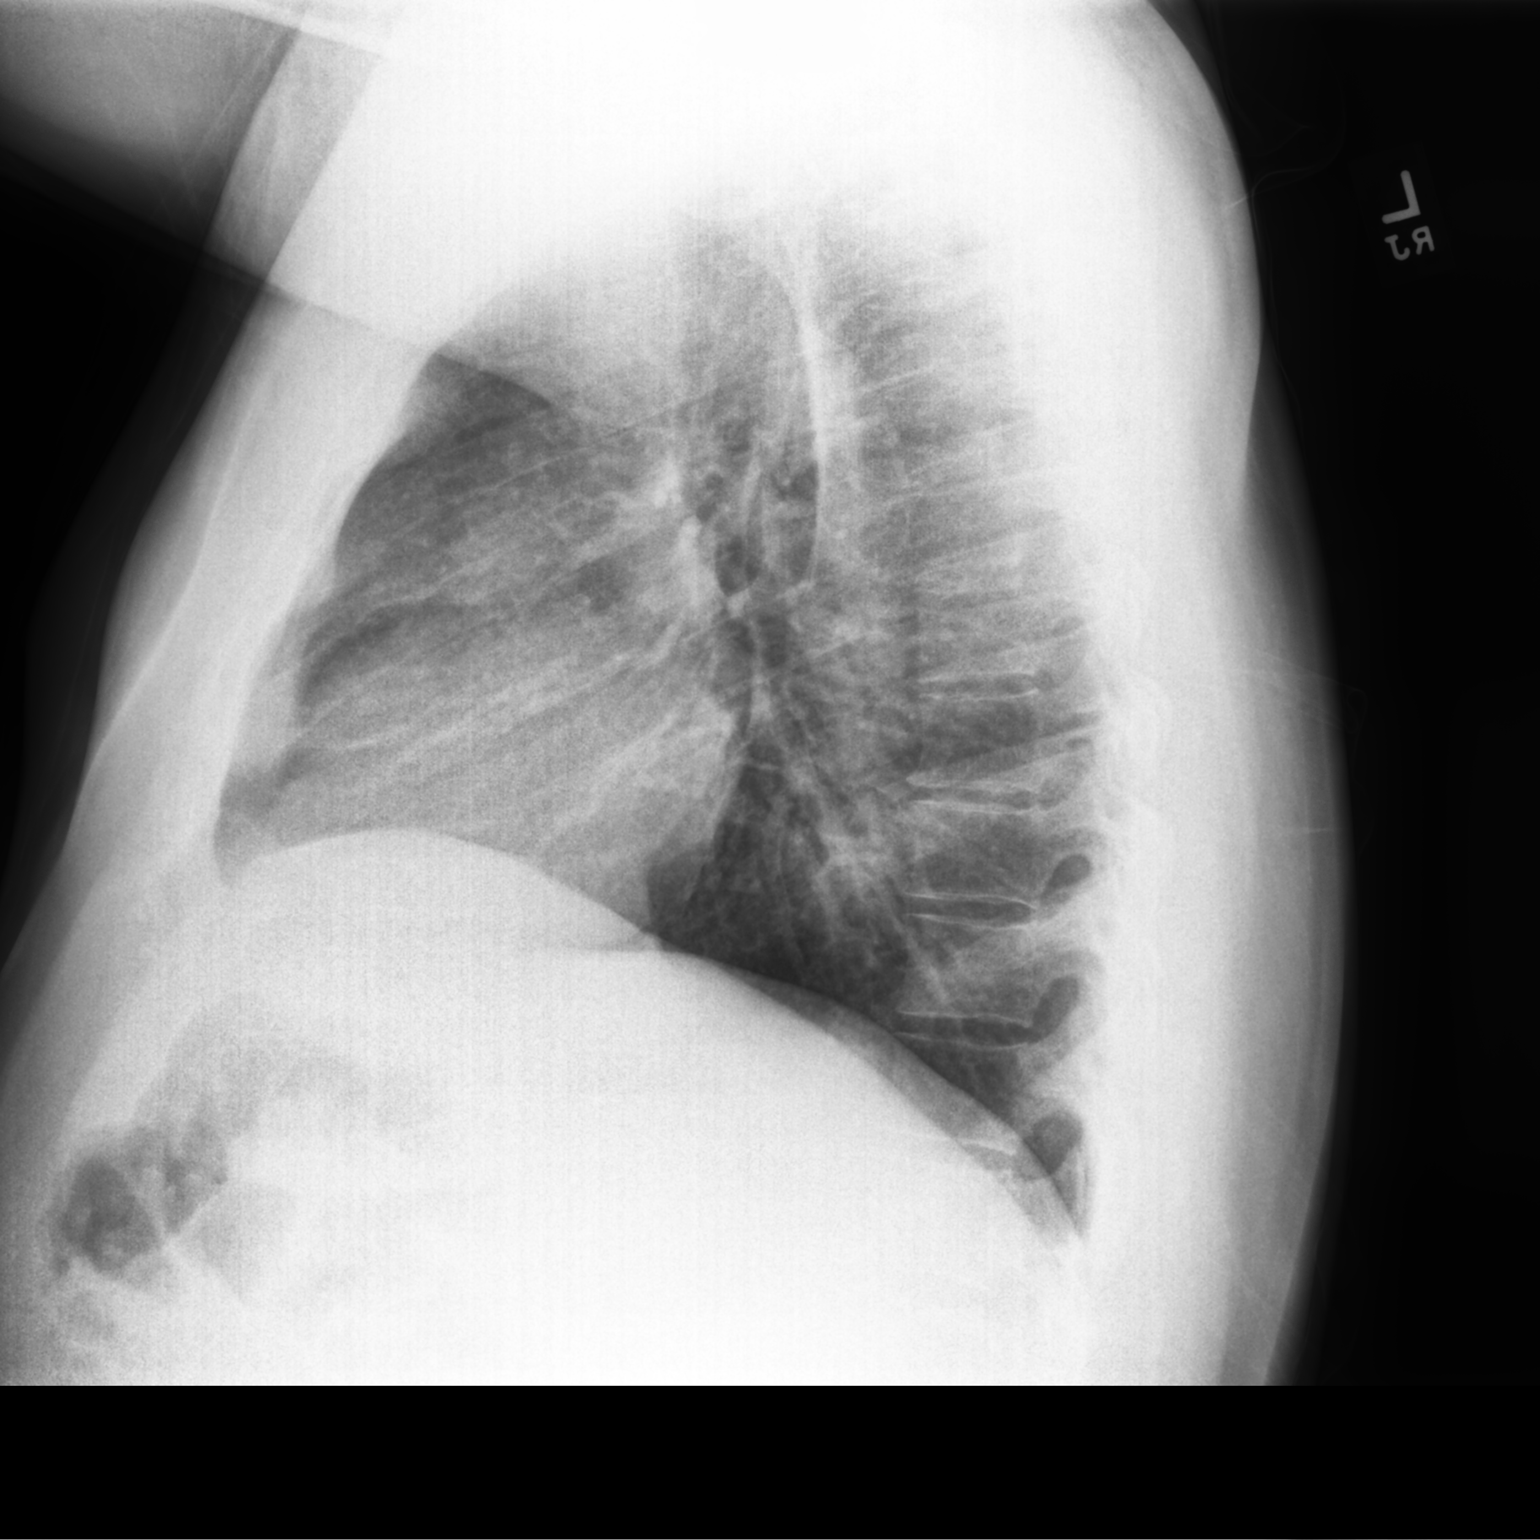

[2 of 2 positions shown; findings below may reference images not displayed]

FINDINGS: The heart size and mediastinal contours are within normal limits.
Both lungs are clear. No pleural effusion or pneumothorax. The
visualized skeletal structures are unremarkable.
IMPRESSION: No active cardiopulmonary disease.

## 2021-09-11 ENCOUNTER — Other Ambulatory Visit: Payer: Self-pay | Admitting: Osteopathic Medicine

## 2021-10-01 ENCOUNTER — Telehealth: Payer: Self-pay

## 2021-10-01 NOTE — Telephone Encounter (Signed)
Transition Care Management Unsuccessful Follow-up Telephone Call  Date of discharge and from where:  09/30/2021 from [facility]  Attempts:  1st Attempt  Reason for unsuccessful TCM follow-up call:  Left voice message

## 2021-10-02 NOTE — Telephone Encounter (Signed)
Transition Care Management Unsuccessful Follow-up Telephone Call  Date of discharge and from where:  12/04/202 from [facility]  Attempts:  2nd Attempt  Reason for unsuccessful TCM follow-up call:  Left voice message

## 2021-10-03 NOTE — Progress Notes (Signed)
Acute Office Visit  Subjective:    Patient ID: Calvin Long, male    DOB: Apr 12, 1971, 50 y.o.   MRN: [id number]  Chief Complaint  Patient presents with   Follow-up    Pneumonia and muscle pain      HPI Patient is in today for hospital follow-up.  09/26/21 Patient went to the ED via EMS for cardiac arrest. ROSC was returned after one round of CPR. He was given 4mg  of Narcan total and admitted to snorting cocaine - girlfriend reports she thought it may have been laced with heroin. He was hypoxic. CT chest: diffuse bilateral centralized ground glass nodular airspace opacities concerning for hypersensitivity pneumonia and filling defects at left lower lobe segmental bronchus. He was started on Vanc, Cefepime and Doxycycline to cover pneumonia (aspiration?). He was transitioned to Levaquin at discharge. He denied intentional overdose, and instead though he cocaine was laced. UDS was positive for amphetamine and cocaine, but he denied use of methamphetamine. Cr 1.51 treated with IV fluids for AKI. Echo showed EF 60-65%.    Today: Patient reports he is feeling significantly better and is improving every day. Reports his chest is still sore from the CPR and his left shoulder is bothering him as well - reports the shoulder had been hurting for the past few months, just intermittently. Thinks it may have to do with his work in . Overhead motions are most painful, but he reports full range of motion and no significant weakness. Muscle/joint pains have been well controlled with his Mobic, Flexeril, gabapentin - needs Mobic refill.   He also reports having several weeks of some reflux/indigestion. States he gets relief with a liquid antacid. Symptoms tend to occur after heavy meals or alcohol - he has stopped drinking alcohol because of this. He gets immediate relief with the antacid. Episodes only happen a couple of times per week usually. He is wondering if he can take something to  prevent this.   Although ED note suggested possible aspiration pneumonia, patient reports that he was having URI symptoms, cough, fatigue, dyspnea for several weeks ever since his son had the flu. Feeling significantly better since discharge.   He reports his right eye has been seeing some floaters. He previously has had cataracts surgery in this eye. He is planning to see his ophthalmologist soon. No pain.  Patient reports he has not been taking his metformin regularly because of the GI side effects.He has been using [employer] 100 units, walking regularly, and trying to minimize sugar intake. His A1c in the hospital was down to 7.2%    Past Medical History:  Diagnosis Date   Diabetes mellitus     Past Surgical History:  Procedure Laterality Date   NO PAST SURGERIES      Family History  Problem Relation Age of Onset   Diabetes Mother    Diabetes Other    Hypertension Other     Social History   Socioeconomic History   Marital status: Single    Spouse name: Not on file   Number of children: Not on file   Years of education: Not on file   Highest education level: Not on file  Occupational History   Not on file  Tobacco Use   Smoking status: Every Day    Packs/day: 1.00    Types: Cigarettes   Smokeless tobacco: Never  Vaping Use   Vaping Use: Never used  Substance and Sexual Activity   Alcohol use: Yes   Drug  use: No   Sexual activity: Yes    Partners: Female  Other Topics Concern   Not on file  Social History Narrative   Not on file   Social Determinants of Health   Financial Resource Strain: Not on file  Food Insecurity: Not on file  Transportation Needs: Not on file  Physical Activity: Not on file  Stress: Not on file  Social Connections: Not on file  Intimate Partner Violence: Not on file    Outpatient Medications Prior to Visit  Medication Sig Dispense Refill   albuterol (VENTOLIN HFA) 108 (90 Base) MCG/ACT inhaler INHALE 2 PUFFS BY MOUTH EVERY 6  HOURS AS NEEDED FOR WHEEZING AND FOR SHORTNESS OF BREATH AND FOR COUGH 9 g 0   cyclobenzaprine (FLEXERIL) 10 MG tablet Take 0.5-1 tablets (5-10 mg total) by mouth 3 (three) times daily as needed for muscle spasms. Caution: can cause drowsiness 60 tablet 1   fluticasone furoate-vilanterol (BREO ELLIPTA) 100-25 MCG/INH AEPB Inhale 1 puff into the lungs daily. 1 each 3   gabapentin (NEURONTIN) 300 MG capsule TAKE 1 TO 2 CAPSULES BY MOUTH THREE TIMES DAILY AS NEEDED 180 capsule 1   glucose blood (FREESTYLE TEST STRIPS) test strip Check blood sugar once a day, first thing in the morning. 50 each 5   Insulin Pen Needle (PEN NEEDLES) 32G X 5 MM MISC Use as directed 100 each 99   ketoconazole (NIZORAL) 2 % shampoo Apply 1 application topically 2 (two) times a week. 240 mL 1   metFORMIN (GLUCOPHAGE-XR) 500 MG 24 hr tablet Take 1 to 2 tablets by mouth daily. 180 tablet 0   olmesartan (BENICAR) 20 MG tablet Take 1 tablet by mouth daily. **Replaces Losartan** 90 tablet 0   TRESIBA FLEXTOUCH 200 UNIT/ML FlexTouch Pen Inject 40 to 100 units subcutaneously at bedtime. 27 mL 4   meloxicam (MOBIC) 15 MG tablet Take 1 tablet (15 mg total) by mouth daily as needed for pain. 30 tablet 2   sildenafil (VIAGRA) 100 MG tablet Take 0.5-1 tablets (50-100 mg total) by mouth daily as needed for erectile dysfunction. 30 tablet 11   No facility-administered medications prior to visit.    No Known Allergies  Review of Systems All review of systems negative except what is listed in the HPI     Objective:    Physical Exam Vitals reviewed.  Constitutional:      Appearance: Normal appearance.  HENT:     Head: Normocephalic and atraumatic.  Cardiovascular:     Rate and Rhythm: Normal rate and regular rhythm.     Heart sounds: Normal heart sounds.  Pulmonary:     Effort: Pulmonary effort is normal.     Breath sounds: Normal breath sounds.  Musculoskeletal:     Cervical back: Normal range of motion and neck supple.      Comments: L shoulder: discomfort with Neers, Hawkins, Apley scratch but full ROM  Skin:    General: Skin is warm and dry.     Findings: No rash.  Neurological:     General: No focal deficit present.     Mental Status: He is alert and oriented to person, place, and time. Mental status is at baseline.  Psychiatric:        Mood and Affect: Mood normal.        Behavior: Behavior normal.        Thought Content: Thought content normal.        Judgment: Judgment normal.    BP  136/84 (BP Location: Left Arm, Patient Position: Sitting, Cuff Size: Normal)   Pulse (!) 105   Temp 98.6 F (37 C) (Oral)   Ht 6\' 1"  (1.854 m)   Wt 225 lb (102.1 kg)   SpO2 96%   BMI 29.69 kg/m  Wt Readings from Last 3 Encounters:  10/04/21 225 lb (102.1 kg)  04/02/21 233 lb 0.6 oz (105.7 kg)  01/16/21 231 lb 3.2 oz (104.9 kg)    Health Maintenance Due  Topic Date Due   HIV Screening  Never done   FOOT EXAM  06/02/2021   HEMOGLOBIN A1C  10/02/2021    There are no preventive care reminders to display for this patient.   No results found for: TSH Lab Results  Component Value Date   WBC 5.5 06/02/2020   HGB 15.1 06/02/2020   HCT 42.7 06/02/2020   MCV 89.9 06/02/2020   PLT 194 06/02/2020   Lab Results  Component Value Date   NA 136 06/02/2020   K 4.5 06/02/2020   CO2 24 06/02/2020   GLUCOSE 320 (H) 06/02/2020   BUN 24 06/02/2020   CREATININE 1.03 06/02/2020   BILITOT 0.6 06/02/2020   AST 13 06/02/2020   ALT 29 06/02/2020   PROT 6.8 06/02/2020   CALCIUM 8.5 (L) 06/02/2020   ANIONGAP 12 08/14/2017   Lab Results  Component Value Date   CHOL 147 09/20/2019   Lab Results  Component Value Date   HDL 35 (L) 09/20/2019   Lab Results  Component Value Date   LDLCALC 88 09/20/2019   Lab Results  Component Value Date   TRIG 142 09/20/2019   Lab Results  Component Value Date   CHOLHDL 4.2 09/20/2019   Lab Results  Component Value Date   HGBA1C 8.2 (A) 04/02/2021        Assessment & Plan:   1. Need for influenza vaccination - Flu Vaccine QUAD 6+ mos PF IM (Fluarix Quad PF)  2. Hospital discharge follow-up Rechecking labs. Has improved significantly since discharge. Encouraged him to finish out antibiotic as prescribed, stay well hydrated, activity as tolerate, heart healthy, carb controlled diet. Monitor glucose at home.  - CBC with Differential/Platelet - Comprehensive metabolic panel  3. Encounter for smoking cessation counseling Reports he is determined to quit smoking - wants to try nicotine patches.  - nicotine (NICODERM CQ - DOSED IN MG/24 HOURS) 21 mg/24hr patch; Place 1 patch (21 mg total) onto the skin daily.  Dispense: 42 patch; Refill: 0  4. AKI (acute kidney injury) (HCC) Rechecking labs from hospital stay. Stay well hydrated.  - CBC with Differential/Platelet - Comprehensive metabolic panel  5. Acute pain of left shoulder Consider rotation cuff or possibly some impingement. Given home exercises. Mobic refilled. Can try ice, heat, massage, etc. Consider formal PT. If no improvement in 4 weeks can xray and refer to sports medicine.  - meloxicam (MOBIC) 15 MG tablet; Take 1 tablet (15 mg total) by mouth daily as needed for pain.  Dispense: 30 tablet; Refill: 2  6. Gastroesophageal reflux disease, unspecified whether esophagitis present Discussed avoiding triggers. If symptoms are only a few times per week, can continue with PRN acid reducers, Tums, Pepcid, etc. If more consistent, can do a trial of protonix daily for a few weeks.  - pantoprazole (PROTONIX) 40 MG tablet; Take 1 tablet (40 mg total) by mouth daily.  Dispense: 30 tablet; Refill: 3  7. Allergic rhinitis, unspecified seasonality, unspecified trigger Continue Zyrtec. Adding daily flonase. He will  consider Allergist referral - says he may try over the summer.  - fluticasone (FLONASE) 50 MCG/ACT nasal spray; Place 2 sprays into both nostrils daily.  Dispense: 1 g; Refill: 2  8.  Erectile dysfunction, unspecified erectile dysfunction type Refills.  - sildenafil (VIAGRA) 100 MG tablet; Take 0.5-1 tablets (50-100 mg total) by mouth daily as needed for erectile dysfunction.  Dispense: 30 tablet; Refill: 5    Recommend follow-up in one month to establish with new PCP and reevaluate shoulder pain. Please contact office for sooner follow-up if symptoms do not improve or worsen. Seek emergency care if symptoms become severe.   Lollie Marrow Reola Calkins, DNP, FNP-C

## 2021-10-03 NOTE — Telephone Encounter (Signed)
Transition Care Management Unsuccessful Follow-up Telephone Call  Date of discharge and from where:  09/30/2021 from [facility]  Attempts:  3rd Attempt  Reason for unsuccessful TCM follow-up call:  Left voice message

## 2021-10-04 ENCOUNTER — Other Ambulatory Visit: Payer: Self-pay

## 2021-10-04 ENCOUNTER — Encounter: Payer: Self-pay | Admitting: Family Medicine

## 2021-10-04 ENCOUNTER — Ambulatory Visit (INDEPENDENT_AMBULATORY_CARE_PROVIDER_SITE_OTHER): Payer: BC Managed Care – PPO | Admitting: Family Medicine

## 2021-10-04 VITALS — BP 136/84 | HR 105 | Temp 98.6°F | Ht 73.0 in | Wt 225.0 lb

## 2021-10-04 DIAGNOSIS — N179 Acute kidney failure, unspecified: Secondary | ICD-10-CM

## 2021-10-04 DIAGNOSIS — Z716 Tobacco abuse counseling: Secondary | ICD-10-CM

## 2021-10-04 DIAGNOSIS — Z23 Encounter for immunization: Secondary | ICD-10-CM | POA: Diagnosis not present

## 2021-10-04 DIAGNOSIS — M25512 Pain in left shoulder: Secondary | ICD-10-CM

## 2021-10-04 DIAGNOSIS — N529 Male erectile dysfunction, unspecified: Secondary | ICD-10-CM

## 2021-10-04 DIAGNOSIS — K219 Gastro-esophageal reflux disease without esophagitis: Secondary | ICD-10-CM

## 2021-10-04 DIAGNOSIS — J309 Allergic rhinitis, unspecified: Secondary | ICD-10-CM

## 2021-10-04 DIAGNOSIS — Z09 Encounter for follow-up examination after completed treatment for conditions other than malignant neoplasm: Secondary | ICD-10-CM

## 2021-10-04 MED ORDER — FLUTICASONE PROPIONATE 50 MCG/ACT NA SUSP: 2.0000 | Freq: Every day | NASAL | 2 refills | Status: AC

## 2021-10-04 MED ORDER — NICOTINE 21 MG/24HR TD PT24: 21.0000 mg | MEDICATED_PATCH | TRANSDERMAL | 0 refills | Status: DC

## 2021-10-04 MED ORDER — SILDENAFIL CITRATE 100 MG PO TABS: 50.0000 mg | ORAL_TABLET | Freq: Every day | ORAL | 5 refills | Status: AC | PRN

## 2021-10-04 MED ORDER — PANTOPRAZOLE SODIUM 40 MG PO TBEC: 40.0000 mg | DELAYED_RELEASE_TABLET | Freq: Every day | ORAL | 3 refills | Status: AC

## 2021-10-04 MED ORDER — MELOXICAM 15 MG PO TABS: 15.0000 mg | ORAL_TABLET | Freq: Every day | ORAL | 2 refills | Status: DC | PRN

## 2021-10-05 ENCOUNTER — Other Ambulatory Visit: Payer: Self-pay | Admitting: Osteopathic Medicine

## 2021-10-05 LAB — COMPREHENSIVE METABOLIC PANEL
AG Ratio: 1.4 (calc) (ref 1.0–2.5)
ALT: 19 U/L (ref 9–46)
AST: 11 U/L (ref 10–35)
Albumin: 4 g/dL (ref 3.6–5.1)
Alkaline phosphatase (APISO): 60 U/L (ref 35–144)
BUN: 16 mg/dL (ref 7–25)
CO2: 29 mmol/L (ref 20–32)
Calcium: 9.1 mg/dL (ref 8.6–10.3)
Chloride: 104 mmol/L (ref 98–110)
Creat: 0.88 mg/dL (ref 0.70–1.30)
Globulin: 2.9 g/dL (calc) (ref 1.9–3.7)
Glucose, Bld: 117 mg/dL — ABNORMAL HIGH (ref 65–99)
Potassium: 4.9 mmol/L (ref 3.5–5.3)
Sodium: 142 mmol/L (ref 135–146)
Total Bilirubin: 0.6 mg/dL (ref 0.2–1.2)
Total Protein: 6.9 g/dL (ref 6.1–8.1)

## 2021-10-05 LAB — CBC WITH DIFFERENTIAL/PLATELET
Absolute Monocytes: 524 cells/uL (ref 200–950)
Basophils Absolute: 49 cells/uL (ref 0–200)
Basophils Relative: 0.5 %
Eosinophils Absolute: 204 cells/uL (ref 15–500)
Eosinophils Relative: 2.1 %
HCT: 44.2 % (ref 38.5–50.0)
Hemoglobin: 15.2 g/dL (ref 13.2–17.1)
Lymphs Abs: 1484 cells/uL (ref 850–3900)
MCH: 31 pg (ref 27.0–33.0)
MCHC: 34.4 g/dL (ref 32.0–36.0)
MCV: 90.2 fL (ref 80.0–100.0)
MPV: 9.8 fL (ref 7.5–12.5)
Monocytes Relative: 5.4 %
Neutro Abs: 7440 cells/uL (ref 1500–7800)
Neutrophils Relative %: 76.7 %
Platelets: 325 10*3/uL (ref 140–400)
RBC: 4.9 10*6/uL (ref 4.20–5.80)
RDW: 13.1 % (ref 11.0–15.0)
Total Lymphocyte: 15.3 %
WBC: 9.7 10*3/uL (ref 3.8–10.8)

## 2021-11-02 ENCOUNTER — Other Ambulatory Visit: Payer: Self-pay | Admitting: Osteopathic Medicine

## 2021-11-04 ENCOUNTER — Other Ambulatory Visit: Payer: Self-pay | Admitting: Physician Assistant

## 2021-11-06 ENCOUNTER — Ambulatory Visit: Payer: BC Managed Care – PPO | Admitting: Family Medicine

## 2021-11-27 ENCOUNTER — Other Ambulatory Visit: Payer: Self-pay | Admitting: Osteopathic Medicine

## 2021-11-29 ENCOUNTER — Other Ambulatory Visit: Payer: Self-pay | Admitting: Osteopathic Medicine

## 2021-11-29 ENCOUNTER — Other Ambulatory Visit: Payer: Self-pay | Admitting: Family Medicine

## 2021-11-29 DIAGNOSIS — Z794 Long term (current) use of insulin: Secondary | ICD-10-CM

## 2021-11-29 DIAGNOSIS — E1169 Type 2 diabetes mellitus with other specified complication: Secondary | ICD-10-CM

## 2022-01-03 ENCOUNTER — Other Ambulatory Visit: Payer: Self-pay | Admitting: Medical-Surgical

## 2022-01-03 ENCOUNTER — Other Ambulatory Visit: Payer: Self-pay | Admitting: Physician Assistant

## 2022-01-03 NOTE — Telephone Encounter (Signed)
Please call patient to schedule appointment/establish care with new PCP. Needs appointment to refill medication.  ?

## 2022-01-03 NOTE — Telephone Encounter (Signed)
Noted. Prescription sent for 30 days.  ?

## 2022-01-03 NOTE — Telephone Encounter (Signed)
Spoke with patient and he stated he currently is in the middle of trying to get a new job with insurance, as he currently does not have insurance. I let patient know about the self pay discount & the patient assistance forms up front. He stated he would be coming by this afternoon to pick up the financial assistance forms & he stated he would like to continue getting medication until he gets insurance and is able to schedule an appointment, but he will pick up the financial assistance forms & review them. AM ?

## 2022-03-07 ENCOUNTER — Encounter: Payer: Self-pay | Admitting: Podiatry

## 2022-09-23 ENCOUNTER — Telehealth: Payer: Self-pay | Admitting: Podiatry

## 2022-09-23 NOTE — Telephone Encounter (Signed)
Tried to reach patient for orthotics, no answer no vm , this is 2nd attempt to reach  first attempt 03/07/22 < mailed letter on this date as well.   No Balance for orthotics.

## 2022-11-05 NOTE — Telephone Encounter (Signed)
Tried to reach patient for orthotics, no answer no vm , this is 2nd attempt to reach  first attempt 03/07/22    3rd attempt to reach patient for orthotics

## 2022-11-05 NOTE — Telephone Encounter (Signed)
Mailed orthotic to patient residence

## 2023-09-29 ENCOUNTER — Ambulatory Visit
Admission: EM | Admit: 2023-09-29 | Discharge: 2023-09-29 | Disposition: A | Discharge status: Home / Self Care | Payer: Medicaid Other | Attending: Family Medicine | Admitting: Family Medicine

## 2023-09-29 ENCOUNTER — Other Ambulatory Visit: Payer: Self-pay

## 2023-09-29 DIAGNOSIS — E109 Type 1 diabetes mellitus without complications: Secondary | ICD-10-CM | POA: Diagnosis not present

## 2023-09-29 DIAGNOSIS — J019 Acute sinusitis, unspecified: Secondary | ICD-10-CM | POA: Diagnosis not present

## 2023-09-29 DIAGNOSIS — R030 Elevated blood-pressure reading, without diagnosis of hypertension: Secondary | ICD-10-CM

## 2023-09-29 DIAGNOSIS — J340 Abscess, furuncle and carbuncle of nose: Secondary | ICD-10-CM | POA: Diagnosis not present

## 2023-09-29 MED ORDER — CEFTRIAXONE SODIUM 1 G IJ SOLR
1000.0000 mg | Freq: Once | INTRAMUSCULAR | Status: AC
  Administered 2023-09-29: 1000 mg via INTRAMUSCULAR

## 2023-09-29 MED ORDER — AMOXICILLIN-POT CLAVULANATE 875-125 MG PO TABS: 1.0000 | ORAL_TABLET | Freq: Two times a day (BID) | ORAL | 0 refills | Status: DC

## 2023-09-29 MED ORDER — HYDROCODONE-ACETAMINOPHEN 7.5-325 MG PO TABS: 1.0000 | ORAL_TABLET | Freq: Four times a day (QID) | ORAL | 0 refills | Status: AC | PRN

## 2023-09-29 MED ORDER — MUPIROCIN 2 % EX OINT: 1.0000 | TOPICAL_OINTMENT | Freq: Two times a day (BID) | CUTANEOUS | 0 refills | Status: AC

## 2023-09-29 NOTE — ED Triage Notes (Signed)
For week and half has had fatigue, sinus pain, sores in nose "that feel infected, really painful", redness to nose, lesion to top of forehead, chills. Here requesting antibiotics.

## 2023-09-29 NOTE — ED Provider Notes (Signed)
[facility]    CSN: [id number] Arrival date & time: 09/29/23  1911      History   Chief Complaint No chief complaint on file.   HPI Osaze Kleinfeldt is a 52 y.o. male.   HPI  Patient is here because he has had a sinus infection and cold for over a week.  He has developed some sores inside of his nose and his whole nose is swollen and extremely painful.  He feels like he has a bacterial infection in his nose.  He has been trying to find cold symptoms at home.  He has been using his Flonase and over-the-counter medications.  He has had sweats and chills, thought he had some fever but has not taken his temperature  Past Medical History:  Diagnosis Date   Diabetes mellitus     Patient Active Problem List   Diagnosis Date Noted   COVID-19 08/24/2020   Hepatitis C virus infection without hepatic coma 10/04/2019   Paronychia of right thumb 09/03/2012   Type 2 diabetes mellitus (HCC) 07/15/2012   DKA, type 2, not at goal [facility]) 06/29/2012   Dehydration 06/29/2012   Hyponatremia 06/29/2012   Tobacco abuse 06/29/2012   Alcohol use 06/29/2012   HTN (hypertension) 06/29/2012   Skin rash 06/29/2012   Thrombocytopenia (HCC) 06/29/2012    Past Surgical History:  Procedure Laterality Date   NO PAST SURGERIES         Home Medications    Prior to Admission medications   Medication Sig Start Date End Date Taking? Authorizing Provider  amoxicillin-clavulanate (AUGMENTIN) 875-125 MG tablet Take 1 tablet by mouth every 12 (twelve) hours. 09/29/23  Yes Eustace Moore, MD  HYDROcodone-acetaminophen (NORCO) 7.5-325 MG tablet Take 1 tablet by mouth every 6 (six) hours as needed for moderate pain (pain score 4-6). 09/29/23  Yes Eustace Moore, MD  mupirocin ointment (BACTROBAN) 2 % Apply 1 Application topically 2 (two) times daily. 09/29/23  Yes Eustace Moore, MD  albuterol (VENTOLIN HFA) 108 (90 Base) MCG/ACT inhaler INHALE 2 PUFFS BY MOUTH EVERY 6 HOURS AS  NEEDED FOR WHEEZING AND FOR SHORTNESS OF BREATH AND FOR COUGH 09/11/21   Breeback, Jade L, PA-C  fluticasone (FLONASE) 50 MCG/ACT nasal spray Place 2 sprays into both nostrils daily. 10/04/21 10/04/22  Clayborne Dana, NP  fluticasone furoate-vilanterol (BREO ELLIPTA) 100-25 MCG/INH AEPB Inhale 1 puff into the lungs daily. 01/16/21   Olalere, Onnie Boer A, MD  gabapentin (NEURONTIN) 300 MG capsule TAKE 1 TO 2 CAPSULES BY MOUTH THREE TIMES DAILY AS NEEDED. *NO REFILLS. NEEDS TO TRANSFER CARE TO NEW PRESCRIBER* 01/03/22   Tandy Gaw L, PA-C  glucose blood (FREESTYLE TEST STRIPS) test strip Check blood sugar once a day, first thing in the morning. 12/24/13   Hommel, Gregary Signs, DO  insulin degludec (TRESIBA FLEXTOUCH) 200 UNIT/ML FlexTouch Pen Inject 40 to 100 units subcutaneously at bedtime. NO REFILLS. NEEDS TO TRANSFER CARE TO NEW PCP 11/30/21   Tandy Gaw L, PA-C  Insulin Pen Needle (PEN NEEDLES) 32G X 5 MM MISC Use as directed 08/24/20   Sunnie Nielsen, DO  olmesartan (BENICAR) 20 MG tablet Take 1 tablet by mouth daily. **Replaces Losartan** 01/03/22   Christen Butter, NP  pantoprazole (PROTONIX) 40 MG tablet Take 1 tablet (40 mg total) by mouth daily. 10/04/21   Clayborne Dana, NP  sildenafil (VIAGRA) 100 MG tablet Take 0.5-1 tablets (50-100 mg total) by mouth daily as needed for erectile dysfunction. 10/04/21   Reola Calkins,  Lollie Marrow, NP    Family History Family History  Problem Relation Age of Onset   Diabetes Mother    Diabetes Other    Hypertension Other     Social History Social History   Tobacco Use   Smoking status: Every Day    Current packs/day: 1.00    Types: Cigarettes   Smokeless tobacco: Never  Vaping Use   Vaping status: Never Used  Substance Use Topics   Alcohol use: Yes   Drug use: No     Allergies   Patient has no known allergies.   Review of Systems Review of Systems See HPI  Physical Exam Triage Vital Signs ED Triage Vitals  Encounter Vitals Group     BP 09/29/23 1926  (!) 180/101     Systolic BP Percentile --      Diastolic BP Percentile --      Pulse Rate 09/29/23 1926 90     Resp 09/29/23 1926 16     Temp 09/29/23 1926 98.2 F (36.8 C)     Temp Source 09/29/23 1926 Oral     SpO2 09/29/23 1926 100 %     Weight --      Height --      Head Circumference --      Peak Flow --      Pain Score 09/29/23 1931 7     Pain Loc --      Pain Education --      Exclude from Growth Chart --    No data found.  Updated Vital Signs BP (!) 180/101   Pulse 90   Temp 98.2 F (36.8 C) (Oral)   Resp 16   SpO2 100%       Physical Exam Constitutional:      General: He is not in acute distress.    Appearance: He is well-developed and normal weight. He is ill-appearing.  HENT:     Head: Normocephalic and atraumatic.     Right Ear: Tympanic membrane and ear canal normal.     Left Ear: Ear canal normal.     Nose:     Comments: The entire tip of the nose is erythematous and swollen.  Inside the nares there is swelling and yellow crusting.  Nostrils are almost swollen shut with infection.    Mouth/Throat:     Pharynx: Posterior oropharyngeal erythema present.  Eyes:     Conjunctiva/sclera: Conjunctivae normal.     Pupils: Pupils are equal, round, and reactive to light.  Cardiovascular:     Rate and Rhythm: Normal rate and regular rhythm.     Heart sounds: Normal heart sounds.  Pulmonary:     Effort: Pulmonary effort is normal. No respiratory distress.     Breath sounds: Normal breath sounds.  Abdominal:     General: There is no distension.     Palpations: Abdomen is soft.  Musculoskeletal:        General: Normal range of motion.     Cervical back: Normal range of motion.  Lymphadenopathy:     Cervical: Cervical adenopathy present.  Skin:    General: Skin is warm and dry.  Neurological:     Mental Status: He is alert.      UC Treatments / Results  Labs (all labs ordered are listed, but only abnormal results are displayed) Labs Reviewed - No  data to display  EKG   Radiology No results found.  Procedures Procedures (including critical care time)  Medications Ordered  in UC Medications  cefTRIAXone (ROCEPHIN) injection 1,000 mg (has no administration in time range)    Initial Impression / Assessment and Plan / UC Course  I have reviewed the triage vital signs and the nursing notes.  Pertinent labs & imaging results that were available during my care of the patient were reviewed by me and considered in my medical decision making (see chart for details).     Concern for cellulitis in a diabetic who is having chills.  Will start him with a shot of Rocephin.  Follow him with Augmentin.  He is advised that if he fails to improve he needs to either come here or consider an emergency room. 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  Start tomorrow Take Tylenol or ibuprofen for moderate pain Take hydrocodone for severe pain May use a nasal saline spray to help with mucus.  Do not use Flonase until the infection starts to improve Call or return if not improving as expected in a couple of days    ED Prescriptions     Medication Sig Dispense Auth. Provider   amoxicillin-clavulanate (AUGMENTIN) 875-125 MG tablet Take 1 tablet by mouth every 12 (twelve) hours. 14 tablet Eustace Moore, MD   HYDROcodone-acetaminophen [facility]) 7.5-325 MG tablet Take 1 tablet by mouth every 6 (six) hours as needed for moderate pain (pain score 4-6). 10 tablet Eustace Moore, MD   mupirocin ointment (BACTROBAN) 2 % Apply 1 Application topically 2 (two) times daily. 22 g  Eustace Moore, MD      I have reviewed the PDMP during this encounter.   Eustace Moore, MD 09/29/23 2003

## 2023-09-29 NOTE — Discharge Instructions (Signed)
Apply Bactroban inside nostrils, as tolerated, twice a day Take the Augmentin 2 times a day.  Start tomorrow Take Tylenol or ibuprofen for moderate pain Take hydrocodone for severe pain May use a nasal saline spray to help with mucus.  Do not use Flonase until the infection starts to improve Call or return if not improving as expected in a couple of days

## 2023-10-02 ENCOUNTER — Telehealth: Payer: Self-pay

## 2023-10-02 NOTE — Telephone Encounter (Signed)
Pt called stating he may left his ID here. Advised I will check with pt access when she returns from lunch.

## 2024-04-28 ENCOUNTER — Ambulatory Visit
Admission: EM | Admit: 2024-04-28 | Discharge: 2024-04-28 | Disposition: A | Discharge status: Home / Self Care | Attending: Family Medicine | Admitting: Family Medicine

## 2024-04-28 DIAGNOSIS — L723 Sebaceous cyst: Secondary | ICD-10-CM

## 2024-04-28 DIAGNOSIS — L739 Follicular disorder, unspecified: Secondary | ICD-10-CM | POA: Diagnosis not present

## 2024-04-28 MED ORDER — DOXYCYCLINE HYCLATE 100 MG PO CAPS: 100.0000 mg | ORAL_CAPSULE | Freq: Two times a day (BID) | ORAL | 0 refills | Status: AC

## 2024-04-28 NOTE — ED Provider Notes (Signed)
 [facility]    CSN: [id number] Arrival date & time: 04/28/24  1743      History   Chief Complaint Chief Complaint  Patient presents with   Rash    HPI Calvin Long is a 53 y.o. male.   HPI 53 year old male presents with rash of neck for 2 weeks. Patient reports opened previous scab of neck.  Patient reports more fatigued than normal and has used OTC antibiotic cream.  PMH significant for obesity, T2DM, and tobacco abuse.  Past Medical History:  Diagnosis Date   Diabetes mellitus     Patient Active Problem List   Diagnosis Date Noted   COVID-19 08/24/2020   Hepatitis C virus infection without hepatic coma 10/04/2019   Paronychia of right thumb 09/03/2012   Type 2 diabetes mellitus (HCC) 07/15/2012   DKA, type 2, not at goal [facility]) 06/29/2012   Dehydration 06/29/2012   Hyponatremia 06/29/2012   Tobacco abuse 06/29/2012   Alcohol use 06/29/2012   HTN (hypertension) 06/29/2012   Skin rash 06/29/2012   Thrombocytopenia (HCC) 06/29/2012    Past Surgical History:  Procedure Laterality Date   NO PAST SURGERIES         Home Medications    Prior to Admission medications   Medication Sig Start Date End Date Taking? Authorizing Provider  doxycycline  (VIBRAMYCIN ) 100 MG capsule Take 1 capsule (100 mg total) by mouth 2 (two) times daily for 10 days. 04/28/24 05/08/24 Yes Teddy Sharper, FNP  albuterol  (VENTOLIN  HFA) 108 (90 Base) MCG/ACT inhaler INHALE 2 PUFFS BY MOUTH EVERY 6 HOURS AS NEEDED FOR WHEEZING AND FOR SHORTNESS OF BREATH AND FOR COUGH 09/11/21   Breeback, Jade L, PA-C  fluticasone  (FLONASE ) 50 MCG/ACT nasal spray Place 2 sprays into both nostrils daily. 10/04/21 10/04/22  Almarie Waddell NOVAK, NP  fluticasone  furoate-vilanterol (BREO ELLIPTA ) 100-25 MCG/INH AEPB Inhale 1 puff into the lungs daily. 01/16/21   Olalere, Jennet A, MD  gabapentin  (NEURONTIN ) 300 MG capsule TAKE 1 TO 2 CAPSULES BY MOUTH THREE TIMES DAILY AS NEEDED. *NO REFILLS. NEEDS TO TRANSFER  CARE TO NEW PRESCRIBER* 01/03/22   Breeback, Jade L, PA-C  glucose blood (FREESTYLE TEST STRIPS) test strip Check blood sugar once a day, first thing in the morning. 12/24/13   Hessie Mallick, DO  HYDROcodone -acetaminophen  (NORCO) 7.5-325 MG tablet Take 1 tablet by mouth every 6 (six) hours as needed for moderate pain (pain score 4-6). 09/29/23   Maranda Jamee Jacob, MD  insulin  degludec (TRESIBA  FLEXTOUCH) 200 UNIT/ML FlexTouch Pen Inject 40 to 100 units subcutaneously at bedtime. NO REFILLS. NEEDS TO TRANSFER CARE TO NEW PCP 11/30/21   Breeback, Jade L, PA-C  Insulin  Pen Needle (PEN NEEDLES) 32G X 5 MM MISC Use as directed 08/24/20   Alexander, Natalie, DO  mupirocin  ointment (BACTROBAN ) 2 % Apply 1 Application topically 2 (two) times daily. 09/29/23   Maranda Jamee Jacob, MD  olmesartan  (BENICAR ) 20 MG tablet Take 1 tablet by mouth daily. **Replaces Losartan ** 01/03/22   Willo Mini, NP  pantoprazole  (PROTONIX ) 40 MG tablet Take 1 tablet (40 mg total) by mouth daily. 10/04/21   Almarie Waddell NOVAK, NP  sildenafil  (VIAGRA ) 100 MG tablet Take 0.5-1 tablets (50-100 mg total) by mouth daily as needed for erectile dysfunction. 10/04/21   Almarie Waddell NOVAK, NP    Family History Family History  Problem Relation Age of Onset   Diabetes Mother    Diabetes Other    Hypertension Other     Social History Social History  Tobacco Use   Smoking status: Every Day    Current packs/day: 0.50    Average packs/day: 0.5 packs/day for 30.0 years (15.0 ttl pk-yrs)    Types: Cigarettes    Start date: 04/28/1994   Smokeless tobacco: Never  Vaping Use   Vaping status: Never Used  Substance Use Topics   Alcohol use: Yes   Drug use: No     Allergies   Patient has no known allergies.   Review of Systems Review of Systems  Skin:  Positive for rash.  All other systems reviewed and are negative.    Physical Exam Triage Vital Signs ED Triage Vitals  Encounter Vitals Group     BP      Girls Systolic BP Percentile       Girls Diastolic BP Percentile      Boys Systolic BP Percentile      Boys Diastolic BP Percentile      Pulse      Resp      Temp      Temp src      SpO2      Weight      Height      Head Circumference      Peak Flow      Pain Score      Pain Loc      Pain Education      Exclude from Growth Chart    No data found.  Updated Vital Signs BP 135/88   Pulse (!) 101   Temp 98.1 F (36.7 C)   Resp 19   SpO2 98%    Physical Exam Vitals and nursing note reviewed.  Constitutional:      Appearance: Normal appearance. He is normal weight.  HENT:     Head: Normocephalic and atraumatic.     Mouth/Throat:     Mouth: Mucous membranes are moist.     Pharynx: Oropharynx is clear.  Eyes:     Extraocular Movements: Extraocular movements intact.     Conjunctiva/sclera: Conjunctivae normal.     Pupils: Pupils are equal, round, and reactive to light.  Cardiovascular:     Rate and Rhythm: Normal rate and regular rhythm.     Pulses: Normal pulses.     Heart sounds: Normal heart sounds.  Pulmonary:     Effort: Pulmonary effort is normal.     Breath sounds: Normal breath sounds. No wheezing, rhonchi or rales.  Musculoskeletal:        General: Normal range of motion.     Cervical back: Normal range of motion and neck supple.  Skin:    General: Skin is warm and dry.     Comments: Posterior neck (inferior aspect): Erythematous papule with pustular center noted-please see image below; additionally, ~1.0 cm x 1.0 cm square shaped area of eschar formation, nonerythematous borders noted  Neurological:     General: No focal deficit present.     Mental Status: He is alert and oriented to person, place, and time. Mental status is at baseline.  Psychiatric:        Mood and Affect: Mood normal.        Behavior: Behavior normal.      UC Treatments / Results  Labs (all labs ordered are listed, but only abnormal results are displayed) Labs Reviewed - No data to  display  EKG   Radiology No results found.  Procedures Procedures (including critical care time)  Medications Ordered in UC Medications - No data to  display  Initial Impression / Assessment and Plan / UC Course  I have reviewed the triage vital signs and the nursing notes.  Pertinent labs & imaging results that were available during my care of the patient were reviewed by me and considered in my medical decision making (see chart for details).     MDM: 1.  Folliculitis-Rx'd doxycycline  100 mg capsule: Take 1 capsule twice daily x 10 days; 2.  Sebaceous cyst-Rx'd doxycycline  100 mg capsule: Take 1 capsule twice daily x 10 days. Advised patient to take medication as directed with food to completion.  Encouraged to increase daily water intake to 64 ounces per day while taking this medication.  Advised if symptoms worsen and/or unresolved please follow-up with your PCP or here for further evaluation.  Patient discharged home, hemodynamically stable. Final Clinical Impressions(s) / UC Diagnoses   Final diagnoses:  Sebaceous cyst  Folliculitis     Discharge Instructions      Advised patient to take medication as directed with food to completion.  Encouraged to increase daily water intake to 64 ounces per day while taking this medication.  Advised if symptoms worsen and/or unresolved please follow-up with your PCP or here for further evaluation.     ED Prescriptions     Medication Sig Dispense Auth. Provider   doxycycline  (VIBRAMYCIN ) 100 MG capsule Take 1 capsule (100 mg total) by mouth 2 (two) times daily for 10 days. 20 capsule Meagan Ancona, FNP      PDMP not reviewed this encounter.   Teddy Sharper, FNP 04/28/24 1812

## 2024-04-28 NOTE — Discharge Instructions (Addendum)
 Advised patient to take medication as directed with food to completion.  Encouraged to increase daily water intake to 64 ounces per day while taking this medication.  Advised if symptoms worsen and/or unresolved please follow-up with your PCP or here for further evaluation.

## 2024-04-28 NOTE — ED Triage Notes (Signed)
 Pt presents to uc with rash to neck for 2 weeks with 2 open spots that have scabbed up and one that is red swollen and painful. Pt reports today he has been more fatigued than normal. Has used some otc antibiotic cream.
